# Patient Record
Sex: Male | Born: 1945 | Race: Black or African American | Hispanic: No | Marital: Married | Smoking: Never smoker
Health system: Southern US, Community
[De-identification: ages and names within clinical notes are randomized; demographics above are authoritative.]

## PROBLEM LIST (undated history)

## (undated) DIAGNOSIS — E119 Type 2 diabetes mellitus without complications: Secondary | ICD-10-CM

## (undated) DIAGNOSIS — Z8619 Personal history of other infectious and parasitic diseases: Secondary | ICD-10-CM

## (undated) DIAGNOSIS — K219 Gastro-esophageal reflux disease without esophagitis: Secondary | ICD-10-CM

## (undated) DIAGNOSIS — I10 Essential (primary) hypertension: Secondary | ICD-10-CM

## (undated) DIAGNOSIS — M1990 Unspecified osteoarthritis, unspecified site: Secondary | ICD-10-CM

## (undated) HISTORY — DX: Essential (primary) hypertension: I10

## (undated) HISTORY — DX: Unspecified osteoarthritis, unspecified site: M19.90

## (undated) HISTORY — DX: Type 2 diabetes mellitus without complications: E11.9

## (undated) HISTORY — DX: Personal history of other infectious and parasitic diseases: Z86.19

---

## 1998-04-14 ENCOUNTER — Emergency Department (HOSPITAL_COMMUNITY): Admission: EM | Admit: 1998-04-14 | Discharge: 1998-04-14 | Payer: Self-pay | Admitting: Family Medicine

## 1999-06-27 ENCOUNTER — Emergency Department (HOSPITAL_COMMUNITY): Admission: EM | Admit: 1999-06-27 | Discharge: 1999-06-27 | Payer: Self-pay | Admitting: Emergency Medicine

## 1999-07-03 ENCOUNTER — Emergency Department (HOSPITAL_COMMUNITY): Admission: EM | Admit: 1999-07-03 | Discharge: 1999-07-03 | Payer: Self-pay | Admitting: *Deleted

## 2000-06-06 ENCOUNTER — Emergency Department (HOSPITAL_COMMUNITY): Admission: EM | Admit: 2000-06-06 | Discharge: 2000-06-06 | Payer: Self-pay

## 2000-08-14 ENCOUNTER — Emergency Department (HOSPITAL_COMMUNITY): Admission: EM | Admit: 2000-08-14 | Discharge: 2000-08-14 | Payer: Self-pay | Admitting: Emergency Medicine

## 2007-01-24 ENCOUNTER — Observation Stay (HOSPITAL_COMMUNITY): Admission: EM | Admit: 2007-01-24 | Discharge: 2007-01-24 | Payer: Self-pay | Admitting: Emergency Medicine

## 2011-12-27 DIAGNOSIS — E119 Type 2 diabetes mellitus without complications: Secondary | ICD-10-CM | POA: Diagnosis not present

## 2012-04-18 DIAGNOSIS — E785 Hyperlipidemia, unspecified: Secondary | ICD-10-CM | POA: Diagnosis not present

## 2012-04-18 DIAGNOSIS — K219 Gastro-esophageal reflux disease without esophagitis: Secondary | ICD-10-CM | POA: Diagnosis not present

## 2012-04-18 DIAGNOSIS — Z125 Encounter for screening for malignant neoplasm of prostate: Secondary | ICD-10-CM | POA: Diagnosis not present

## 2012-04-18 DIAGNOSIS — IMO0001 Reserved for inherently not codable concepts without codable children: Secondary | ICD-10-CM | POA: Diagnosis not present

## 2012-11-13 DIAGNOSIS — E785 Hyperlipidemia, unspecified: Secondary | ICD-10-CM | POA: Diagnosis not present

## 2012-11-13 DIAGNOSIS — IMO0001 Reserved for inherently not codable concepts without codable children: Secondary | ICD-10-CM | POA: Diagnosis not present

## 2013-02-05 DIAGNOSIS — IMO0001 Reserved for inherently not codable concepts without codable children: Secondary | ICD-10-CM | POA: Diagnosis not present

## 2013-02-05 DIAGNOSIS — N529 Male erectile dysfunction, unspecified: Secondary | ICD-10-CM | POA: Diagnosis not present

## 2013-02-10 ENCOUNTER — Ambulatory Visit (INDEPENDENT_AMBULATORY_CARE_PROVIDER_SITE_OTHER): Payer: Medicare Other | Admitting: Family Medicine

## 2013-02-10 VITALS — BP 114/72 | HR 80 | Temp 98.6°F | Resp 16 | Ht 66.5 in | Wt 130.4 lb

## 2013-02-10 DIAGNOSIS — B029 Zoster without complications: Secondary | ICD-10-CM

## 2013-02-10 MED ORDER — VALACYCLOVIR HCL 1 G PO TABS: 1000.0000 mg | ORAL_TABLET | Freq: Three times a day (TID) | ORAL | Status: DC

## 2013-02-10 NOTE — Progress Notes (Signed)
67 yo diabetic with 5 days of itchy skin on left back, flank and abdomen.  He never had a shingles shot.  He now has a vesicular rash over the left 11th dermatome.  Objective:  NAD Classic 11th dermatomal left sided rash for shingles:  Vesicular with erythema  Assessment: shingles  Plan: valtrex 1 gm tid x 7days.  Signed, Elvina Sidle, MD

## 2013-02-10 NOTE — Patient Instructions (Signed)
Shingles Shingles (herpes zoster) is an infection that is caused by the same virus that causes chickenpox (varicella). The infection causes a painful skin rash and fluid-filled blisters, which eventually break open, crust over, and heal. It may occur in any area of the body, but it usually affects only one side of the body or face. The pain of shingles usually lasts about 1 month. However, some people with shingles may develop long-term (chronic) pain in the affected area of the body. Shingles often occurs many years after the person had chickenpox. It is more common:  In people older than 50 years.  In people with weakened immune systems, such as those with HIV, AIDS, or cancer.  In people taking medicines that weaken the immune system, such as transplant medicines.  In people under great stress. CAUSES  Shingles is caused by the varicella zoster virus (VZV), which also causes chickenpox. After a person is infected with the virus, it can remain in the person's body for years in an inactive state (dormant). To cause shingles, the virus reactivates and breaks out as an infection in a nerve root. The virus can be spread from person to person (contagious) through contact with open blisters of the shingles rash. It will only spread to people who have not had chickenpox. When these people are exposed to the virus, they may develop chickenpox. They will not develop shingles. Once the blisters scab over, the person is no longer contagious and cannot spread the virus to others. SYMPTOMS  Shingles shows up in stages. The initial symptoms may be pain, itching, and tingling in an area of the skin. This pain is usually described as burning, stabbing, or throbbing.In a few days or weeks, a painful red rash will appear in the area where the pain, itching, and tingling were felt. The rash is usually on one side of the body in a band or belt-like pattern. Then, the rash usually turns into fluid-filled blisters. They  will scab over and dry up in approximately 2 3 weeks. Flu-like symptoms may also occur with the initial symptoms, the rash, or the blisters. These may include:  Fever.  Chills.  Headache.  Upset stomach. DIAGNOSIS  Your caregiver will perform a skin exam to diagnose shingles. Skin scrapings or fluid samples may also be taken from the blisters. This sample will be examined under a microscope or sent to a lab for further testing. TREATMENT  There is no specific cure for shingles. Your caregiver will likely prescribe medicines to help you manage the pain, recover faster, and avoid long-term problems. This may include antiviral drugs, anti-inflammatory drugs, and pain medicines. HOME CARE INSTRUCTIONS   Take a cool bath or apply cool compresses to the area of the rash or blisters as directed. This may help with the pain and itching.   Only take over-the-counter or prescription medicines as directed by your caregiver.   Rest as directed by your caregiver.  Keep your rash and blisters clean with mild soap and cool water or as directed by your caregiver.  Do not pick your blisters or scratch your rash. Apply an anti-itch cream or numbing creams to the affected area as directed by your caregiver.  Keep your shingles rash covered with a loose bandage (dressing).  Avoid skin contact with:  Babies.   Pregnant women.   Children with eczema.   Elderly people with transplants.   People with chronic illnesses, such as leukemia or AIDS.   Wear loose-fitting clothing to help ease   the pain of material rubbing against the rash.  Keep all follow-up appointments with your caregiver.If the area involved is on your face, you may receive a referral for follow-up to a specialist, such as an eye doctor (ophthalmologist) or an ear, nose, and throat (ENT) doctor. Keeping all follow-up appointments will help you avoid eye complications, chronic pain, or disability.  SEEK IMMEDIATE MEDICAL  CARE IF:   You have facial pain, pain around the eye area, or loss of feeling on one side of your face.  You have ear pain or ringing in your ear.  You have loss of taste.  Your pain is not relieved with prescribed medicines.   Your redness or swelling spreads.   You have more pain and swelling.  Your condition is worsening or has changed.   You have a feveror persistent symptoms for more than 2 3 days.  You have a fever and your symptoms suddenly get worse. MAKE SURE YOU:  Understand these instructions.  Will watch your condition.  Will get help right away if you are not doing well or get worse. Document Released: 09/04/2005 Document Revised: 05/29/2012 Document Reviewed: 04/18/2012 [employer] Patient Information 2014 [employer].  

## 2013-02-27 DIAGNOSIS — B0229 Other postherpetic nervous system involvement: Secondary | ICD-10-CM | POA: Diagnosis not present

## 2013-03-05 DIAGNOSIS — E1165 Type 2 diabetes mellitus with hyperglycemia: Secondary | ICD-10-CM | POA: Diagnosis not present

## 2013-03-05 DIAGNOSIS — E1139 Type 2 diabetes mellitus with other diabetic ophthalmic complication: Secondary | ICD-10-CM | POA: Diagnosis not present

## 2013-04-07 DIAGNOSIS — Z1331 Encounter for screening for depression: Secondary | ICD-10-CM | POA: Diagnosis not present

## 2013-04-07 DIAGNOSIS — B0229 Other postherpetic nervous system involvement: Secondary | ICD-10-CM | POA: Diagnosis not present

## 2013-04-07 DIAGNOSIS — R1033 Periumbilical pain: Secondary | ICD-10-CM | POA: Diagnosis not present

## 2013-04-21 ENCOUNTER — Encounter (HOSPITAL_COMMUNITY): Payer: Self-pay | Admitting: *Deleted

## 2013-04-21 ENCOUNTER — Emergency Department (HOSPITAL_COMMUNITY)
Admission: EM | Admit: 2013-04-21 | Discharge: 2013-04-21 | Disposition: A | Discharge status: Home / Self Care | Payer: Medicare Other | Attending: Emergency Medicine | Admitting: Emergency Medicine

## 2013-04-21 DIAGNOSIS — M129 Arthropathy, unspecified: Secondary | ICD-10-CM | POA: Insufficient documentation

## 2013-04-21 DIAGNOSIS — R29818 Other symptoms and signs involving the nervous system: Secondary | ICD-10-CM | POA: Diagnosis not present

## 2013-04-21 DIAGNOSIS — Z7982 Long term (current) use of aspirin: Secondary | ICD-10-CM | POA: Insufficient documentation

## 2013-04-21 DIAGNOSIS — Z794 Long term (current) use of insulin: Secondary | ICD-10-CM | POA: Diagnosis not present

## 2013-04-21 DIAGNOSIS — R519 Headache, unspecified: Secondary | ICD-10-CM

## 2013-04-21 DIAGNOSIS — E119 Type 2 diabetes mellitus without complications: Secondary | ICD-10-CM | POA: Diagnosis not present

## 2013-04-21 DIAGNOSIS — R51 Headache: Secondary | ICD-10-CM | POA: Diagnosis not present

## 2013-04-21 DIAGNOSIS — Z79899 Other long term (current) drug therapy: Secondary | ICD-10-CM | POA: Insufficient documentation

## 2013-04-21 DIAGNOSIS — R110 Nausea: Secondary | ICD-10-CM | POA: Diagnosis not present

## 2013-04-21 NOTE — ED Notes (Signed)
Tonight woke from sleep with sharp pain in Rt side of head, states he was seen at dentist on Monday for puncture to bottom gum and may need to see oral surgery, thinks it may be related since it is on the same side

## 2013-04-21 NOTE — ED Notes (Signed)
Patient is alert and oriented x3.  He was given DC instructions and follow up visit instructions.  Patient gave verbal understanding.  He was DC ambulatory under his own power to home.  V/S stable.  He was not showing any signs of distress on DC 

## 2013-04-21 NOTE — ED Notes (Signed)
Patient is alert and oriented x3.  He is complaining of a headache that started last night in bed when he was awoke from a sharp pain.  He states that his pain has decreased since he has been in the ED.  He denies any other symptoms

## 2013-04-21 NOTE — ED Provider Notes (Signed)
CSN: [id number]     Arrival date & time 04/21/13  0017 History     First MD Initiated Contact with Patient 04/21/13 0058     Chief Complaint  Patient presents with  . Headache    Sharp pain in head   (Consider location/radiation/quality/duration/timing/severity/associated sxs/prior Treatment) HPI Comments: Vision states, that approximately 3 weeks, ago.  He was eating tortillas chips, a puncture the gum over the abnormal bony growth in his mouth.  This is been slow healing.  He was seen by his dentist on Monday, who recommended saltwater gargles.  Tonight after taking a nap.  He woke with sharp pain to the right temporal area.  This concerned him.  He did not take any medication.  Prior to arrival here, but reports that the headache is resolving.  Patient is a 67 y.o. male presenting with headaches. The history is provided by the patient.  Headache Pain location:  R temporal Quality:  Sharp Radiates to:  Does not radiate Severity currently:  2/10 Severity at highest:  5/10 Onset quality:  Sudden Duration:  2 hours Timing:  Constant Progression:  Improving Chronicity:  New Similar to prior headaches: no   Relieved by:  Nothing Ineffective treatments:  None tried Associated symptoms: loss of balance and nausea   Associated symptoms: no congestion, no dizziness, no ear pain, no pain, no fever, no hearing loss, no near-syncope, no neck pain, no sinus pressure and no sore throat     Past Medical History  Diagnosis Date  . Arthritis   . Diabetes mellitus without complication    History reviewed. No pertinent past surgical history. Family History  Problem Relation Age of Onset  . Diabetes Mother    History  Substance Use Topics  . Smoking status: Never Smoker   . Smokeless tobacco: Not on file  . Alcohol Use: No    Review of Systems  Constitutional: Negative for fever.  HENT: Negative for hearing loss, ear pain, congestion, sore throat, neck pain and sinus pressure.    Eyes: Negative for pain.  Cardiovascular: Negative for near-syncope.  Gastrointestinal: Positive for nausea.  Neurological: Positive for headaches and loss of balance. Negative for dizziness.  All other systems reviewed and are negative.    Allergies  Review of patient's allergies indicates no known allergies.  Home Medications   Current Outpatient Rx  Name  Route  Sig  Dispense  Refill  . aspirin 81 MG tablet   Oral   Take 81 mg by mouth daily.         Marland Kitchen HYDROcodone-acetaminophen (NORCO/VICODIN) 5-325 MG per tablet   Oral   Take 0.5 tablets by mouth every 6 (six) hours as needed for pain (Pt states he takes half a tablet).         . insulin glargine (LANTUS) 100 UNIT/ML injection   Subcutaneous   Inject 16 Units into the skin daily. Takes in am         . metFORMIN (GLUCOPHAGE) 850 MG tablet   Oral   Take 425 mg by mouth 2 (two) times daily with a meal. Takes 1/2 tablet of 500mg          . naproxen sodium (ANAPROX) 220 MG tablet   Oral   Take 220 mg by mouth 2 (two) times daily with a meal.         . pregabalin (LYRICA) 100 MG capsule   Oral   Take 100 mg by mouth 2 (two) times daily.  BP 131/76  Pulse 79  Temp(Src) 98.7 F (37.1 C) (Oral)  Resp 18  Ht 5\' 7"  (1.702 m)  Wt 132 lb (59.875 kg)  BMI 20.67 kg/m2  SpO2 99% Physical Exam  Nursing note and vitals reviewed. Constitutional: He is oriented to person, place, and time. He appears well-developed and well-nourished.  HENT:  Head: Normocephalic and atraumatic.  Right Ear: External ear normal.  Left Ear: External ear normal.  Patient has abnormal bony growth inside of his mouth.  He has a superficial lesion over one of these bony growths on the bottom right that appears to be healing.  There is no erythema or drainage  Eyes: Pupils are equal, round, and reactive to light.  Neck: Normal range of motion.  Cardiovascular: Normal rate and regular rhythm.   Pulmonary/Chest: Effort normal and  breath sounds normal.  Musculoskeletal: Normal range of motion. He exhibits no edema.  Lymphadenopathy:    He has no cervical adenopathy.  Neurological: He is alert and oriented to person, place, and time.  Skin: Skin is warm. No rash noted. No erythema.    ED Course   Procedures (including critical care time)  Labs Reviewed - No data to display No results found. 1. Headache     MDM   Patient states, that since, his headache is resolving.  He would rather go home and followup with his primary care physician.  He is refusing any lab work at this time, my concern expressed was for temporal arteritis, but he refused.  This examination, not wanting to wait in the ED for results. He was informed that he could return at any time if he became concerned.  With his health for his headache returned for further evaluation  Arman Filter, NP 04/21/13 0129

## 2013-04-22 NOTE — ED Provider Notes (Signed)
Medical screening examination/treatment/procedure(s) were performed by non-physician practitioner and as supervising physician I was immediately available for consultation/collaboration.    Celene Kras, MD 04/22/13 [id number]

## 2013-04-28 DIAGNOSIS — IMO0001 Reserved for inherently not codable concepts without codable children: Secondary | ICD-10-CM | POA: Diagnosis not present

## 2013-04-28 DIAGNOSIS — B0229 Other postherpetic nervous system involvement: Secondary | ICD-10-CM | POA: Diagnosis not present

## 2013-07-29 DIAGNOSIS — B0229 Other postherpetic nervous system involvement: Secondary | ICD-10-CM | POA: Diagnosis not present

## 2013-07-29 DIAGNOSIS — E785 Hyperlipidemia, unspecified: Secondary | ICD-10-CM | POA: Diagnosis not present

## 2013-07-29 DIAGNOSIS — Z23 Encounter for immunization: Secondary | ICD-10-CM | POA: Diagnosis not present

## 2013-07-29 DIAGNOSIS — IMO0001 Reserved for inherently not codable concepts without codable children: Secondary | ICD-10-CM | POA: Diagnosis not present

## 2013-11-05 DIAGNOSIS — M545 Low back pain, unspecified: Secondary | ICD-10-CM | POA: Diagnosis not present

## 2013-11-05 DIAGNOSIS — E119 Type 2 diabetes mellitus without complications: Secondary | ICD-10-CM | POA: Diagnosis not present

## 2014-07-08 DIAGNOSIS — H0012 Chalazion right lower eyelid: Secondary | ICD-10-CM | POA: Diagnosis not present

## 2014-07-08 DIAGNOSIS — H11159 Pinguecula, unspecified eye: Secondary | ICD-10-CM | POA: Diagnosis not present

## 2014-07-08 DIAGNOSIS — E10319 Type 1 diabetes mellitus with unspecified diabetic retinopathy without macular edema: Secondary | ICD-10-CM | POA: Diagnosis not present

## 2014-07-20 DIAGNOSIS — B0229 Other postherpetic nervous system involvement: Secondary | ICD-10-CM | POA: Diagnosis not present

## 2014-07-20 DIAGNOSIS — Z1389 Encounter for screening for other disorder: Secondary | ICD-10-CM | POA: Diagnosis not present

## 2014-07-20 DIAGNOSIS — M19041 Primary osteoarthritis, right hand: Secondary | ICD-10-CM | POA: Diagnosis not present

## 2014-07-20 DIAGNOSIS — K219 Gastro-esophageal reflux disease without esophagitis: Secondary | ICD-10-CM | POA: Diagnosis not present

## 2014-07-20 DIAGNOSIS — E1165 Type 2 diabetes mellitus with hyperglycemia: Secondary | ICD-10-CM | POA: Diagnosis not present

## 2014-07-20 DIAGNOSIS — E782 Mixed hyperlipidemia: Secondary | ICD-10-CM | POA: Diagnosis not present

## 2014-07-20 DIAGNOSIS — Z125 Encounter for screening for malignant neoplasm of prostate: Secondary | ICD-10-CM | POA: Diagnosis not present

## 2014-07-20 DIAGNOSIS — M19042 Primary osteoarthritis, left hand: Secondary | ICD-10-CM | POA: Diagnosis not present

## 2014-10-13 DIAGNOSIS — Z1211 Encounter for screening for malignant neoplasm of colon: Secondary | ICD-10-CM | POA: Diagnosis not present

## 2014-10-20 DIAGNOSIS — K219 Gastro-esophageal reflux disease without esophagitis: Secondary | ICD-10-CM | POA: Diagnosis not present

## 2014-10-20 DIAGNOSIS — E1165 Type 2 diabetes mellitus with hyperglycemia: Secondary | ICD-10-CM | POA: Diagnosis not present

## 2014-10-20 DIAGNOSIS — E782 Mixed hyperlipidemia: Secondary | ICD-10-CM | POA: Diagnosis not present

## 2014-11-30 DIAGNOSIS — H6982 Other specified disorders of Eustachian tube, left ear: Secondary | ICD-10-CM | POA: Diagnosis not present

## 2014-11-30 DIAGNOSIS — J309 Allergic rhinitis, unspecified: Secondary | ICD-10-CM | POA: Diagnosis not present

## 2015-01-20 DIAGNOSIS — E1165 Type 2 diabetes mellitus with hyperglycemia: Secondary | ICD-10-CM | POA: Diagnosis not present

## 2015-01-20 DIAGNOSIS — E782 Mixed hyperlipidemia: Secondary | ICD-10-CM | POA: Diagnosis not present

## 2015-01-20 DIAGNOSIS — K219 Gastro-esophageal reflux disease without esophagitis: Secondary | ICD-10-CM | POA: Diagnosis not present

## 2015-01-20 DIAGNOSIS — M19041 Primary osteoarthritis, right hand: Secondary | ICD-10-CM | POA: Diagnosis not present

## 2015-02-17 ENCOUNTER — Emergency Department (HOSPITAL_COMMUNITY)
Admission: EM | Admit: 2015-02-17 | Discharge: 2015-02-17 | Disposition: A | Discharge status: Home / Self Care | Payer: Medicare Other | Attending: Emergency Medicine | Admitting: Emergency Medicine

## 2015-02-17 ENCOUNTER — Encounter (HOSPITAL_COMMUNITY): Payer: Self-pay

## 2015-02-17 DIAGNOSIS — M1990 Unspecified osteoarthritis, unspecified site: Secondary | ICD-10-CM | POA: Insufficient documentation

## 2015-02-17 DIAGNOSIS — K5900 Constipation, unspecified: Secondary | ICD-10-CM | POA: Diagnosis not present

## 2015-02-17 DIAGNOSIS — Z79899 Other long term (current) drug therapy: Secondary | ICD-10-CM | POA: Diagnosis not present

## 2015-02-17 DIAGNOSIS — E1165 Type 2 diabetes mellitus with hyperglycemia: Secondary | ICD-10-CM | POA: Diagnosis present

## 2015-02-17 DIAGNOSIS — R42 Dizziness and giddiness: Secondary | ICD-10-CM | POA: Diagnosis not present

## 2015-02-17 DIAGNOSIS — K625 Hemorrhage of anus and rectum: Secondary | ICD-10-CM | POA: Diagnosis not present

## 2015-02-17 DIAGNOSIS — R112 Nausea with vomiting, unspecified: Secondary | ICD-10-CM | POA: Insufficient documentation

## 2015-02-17 DIAGNOSIS — Z7982 Long term (current) use of aspirin: Secondary | ICD-10-CM | POA: Diagnosis not present

## 2015-02-17 LAB — BLOOD GAS, VENOUS
ACID-BASE EXCESS: 3.1 mmol/L — AB (ref 0.0–2.0)
BICARBONATE: 28.9 meq/L — AB (ref 20.0–24.0)
FIO2: 0.21 %
O2 Saturation: 67.9 %
PATIENT TEMPERATURE: 98.6
PO2 VEN: 36.9 mmHg (ref 30.0–45.0)
TCO2: 25.7 mmol/L (ref 0–100)
pCO2, Ven: 50.7 mmHg — ABNORMAL HIGH (ref 45.0–50.0)
pH, Ven: 7.374 — ABNORMAL HIGH (ref 7.250–7.300)

## 2015-02-17 LAB — LIPASE, BLOOD: Lipase: 16 U/L — ABNORMAL LOW (ref 22–51)

## 2015-02-17 LAB — CBC WITH DIFFERENTIAL/PLATELET
BASOS ABS: 0 10*3/uL (ref 0.0–0.1)
Basophils Relative: 1 % (ref 0–1)
Eosinophils Absolute: 0.1 10*3/uL (ref 0.0–0.7)
Eosinophils Relative: 1 % (ref 0–5)
HCT: 41.4 % (ref 39.0–52.0)
HEMOGLOBIN: 13.6 g/dL (ref 13.0–17.0)
Lymphocytes Relative: 19 % (ref 12–46)
Lymphs Abs: 1.7 10*3/uL (ref 0.7–4.0)
MCH: 28.8 pg (ref 26.0–34.0)
MCHC: 32.9 g/dL (ref 30.0–36.0)
MCV: 87.5 fL (ref 78.0–100.0)
MONO ABS: 0.6 10*3/uL (ref 0.1–1.0)
Monocytes Relative: 6 % (ref 3–12)
NEUTROS ABS: 6.4 10*3/uL (ref 1.7–7.7)
Neutrophils Relative %: 73 % (ref 43–77)
Platelets: 297 10*3/uL (ref 150–400)
RBC: 4.73 MIL/uL (ref 4.22–5.81)
RDW: 12.1 % (ref 11.5–15.5)
WBC: 8.9 10*3/uL (ref 4.0–10.5)

## 2015-02-17 LAB — HEPATIC FUNCTION PANEL
ALT: 18 U/L (ref 17–63)
AST: 16 U/L (ref 15–41)
Albumin: 3.7 g/dL (ref 3.5–5.0)
Alkaline Phosphatase: 68 U/L (ref 38–126)
BILIRUBIN DIRECT: 0.2 mg/dL (ref 0.1–0.5)
BILIRUBIN TOTAL: 1.1 mg/dL (ref 0.3–1.2)
Indirect Bilirubin: 0.9 mg/dL (ref 0.3–0.9)
Total Protein: 6.9 g/dL (ref 6.5–8.1)

## 2015-02-17 LAB — URINALYSIS, ROUTINE W REFLEX MICROSCOPIC
Bilirubin Urine: NEGATIVE
HGB URINE DIPSTICK: NEGATIVE
Ketones, ur: 15 mg/dL — AB
Leukocytes, UA: NEGATIVE
Nitrite: NEGATIVE
PH: 7 (ref 5.0–8.0)
Protein, ur: NEGATIVE mg/dL
SPECIFIC GRAVITY, URINE: 1.023 (ref 1.005–1.030)
Urobilinogen, UA: 1 mg/dL (ref 0.0–1.0)

## 2015-02-17 LAB — BASIC METABOLIC PANEL
ANION GAP: 10 (ref 5–15)
BUN: 26 mg/dL — AB (ref 6–20)
CALCIUM: 9 mg/dL (ref 8.9–10.3)
CHLORIDE: 97 mmol/L — AB (ref 101–111)
CO2: 28 mmol/L (ref 22–32)
Creatinine, Ser: 1.04 mg/dL (ref 0.61–1.24)
GFR calc non Af Amer: >60 mL/min (ref >60)
Glucose, Bld: 239 mg/dL — ABNORMAL HIGH (ref 65–99)
Potassium: 4 mmol/L (ref 3.5–5.1)
Sodium: 135 mmol/L (ref 135–145)

## 2015-02-17 LAB — CBG MONITORING, ED: Glucose-Capillary: 216 mg/dL — ABNORMAL HIGH (ref 65–99)

## 2015-02-17 LAB — URINE MICROSCOPIC-ADD ON

## 2015-02-17 MED ORDER — SODIUM CHLORIDE 0.9 % IV BOLUS (SEPSIS)
1000.0000 mL | Freq: Once | INTRAVENOUS | Status: AC
  Administered 2015-02-17: 1000 mL via INTRAVENOUS

## 2015-02-17 MED ORDER — MECLIZINE HCL 32 MG PO TABS: 32.0000 mg | ORAL_TABLET | Freq: Three times a day (TID) | ORAL | Status: DC | PRN

## 2015-02-17 NOTE — ED Notes (Signed)
Ambulated in hall without assistance.

## 2015-02-17 NOTE — Discharge Instructions (Signed)
Make sure to drink plenty of fluids. Take your medications as prescribed by your doctor. Take meclizine as prescribed as needed for dizziness. Follow up with your doctor in 2-3 days for recheck. Return if worsening symptoms.    Benign Positional Vertigo Vertigo means you feel like you or your surroundings are moving when they are not. Benign positional vertigo is the most common form of vertigo. Benign means that the cause of your condition is not serious. Benign positional vertigo is more common in older adults. CAUSES  Benign positional vertigo is the result of an upset in the labyrinth system. This is an area in the middle ear that helps control your balance. This may be caused by a viral infection, head injury, or repetitive motion. However, often no specific cause is found. SYMPTOMS  Symptoms of benign positional vertigo occur when you move your head or eyes in different directions. Some of the symptoms may include:  Loss of balance and falls.  Vomiting.  Blurred vision.  Dizziness.  Nausea.  Involuntary eye movements (nystagmus). DIAGNOSIS  Benign positional vertigo is usually diagnosed by physical exam. If the specific cause of your benign positional vertigo is unknown, your caregiver may perform imaging tests, such as magnetic resonance imaging (MRI) or computed tomography (CT). TREATMENT  Your caregiver may recommend movements or procedures to correct the benign positional vertigo. Medicines such as meclizine, benzodiazepines, and medicines for nausea may be used to treat your symptoms. In rare cases, if your symptoms are caused by certain conditions that affect the inner ear, you may need surgery. HOME CARE INSTRUCTIONS   Follow your caregiver's instructions.  Move slowly. Do not make sudden body or head movements.  Avoid driving.  Avoid operating heavy machinery.  Avoid performing any tasks that would be dangerous to you or others during a vertigo episode.  Drink  enough fluids to keep your urine clear or pale yellow. SEEK IMMEDIATE MEDICAL CARE IF:   You develop problems with walking, weakness, numbness, or using your arms, hands, or legs.  You have difficulty speaking.  You develop severe headaches.  Your nausea or vomiting continues or gets worse.  You develop visual changes.  Your family or friends notice any behavioral changes.  Your condition gets worse.  You have a fever.  You develop a stiff neck or sensitivity to light. MAKE SURE YOU:   Understand these instructions.  Will watch your condition.  Will get help right away if you are not doing well or get worse. Document Released: 06/12/2006 Document Revised: 11/27/2011 Document Reviewed: 05/25/2011 [facility] Patient Information 2015 [place], [place]. This information is not intended to replace advice given to you by your health care provider. Make sure you discuss any questions you have with your health care provider.

## 2015-02-17 NOTE — ED Notes (Signed)
Patient informed that final test result is back.  States, "In 10 minutes, I will have been here three hours. This is poor service." Acknowledged patient's concern and explained the treatment he received (IV fluids, laboratory tests) normally takes some time.

## 2015-02-17 NOTE — ED Notes (Signed)
Pt presents with c/o hyperglycemia. Pt reports he does take medication for his diabetes and waited later than normal to take his insulin earlier this week after having a strawberry slushie, some ice cream, and hamburger.

## 2015-02-17 NOTE — ED Notes (Signed)
Patient reports yesterday morning he became dizzy, lightheaded, generalized weakness with sweating. Pt reports he thought his blood sugar had dropped so he drunk some orange juice. All day yesterday, he felt bad and his blood sugar was in the 200-300's. This morning, he felt "swimming headed" with slight nausea. Denies blurry vision.

## 2015-02-17 NOTE — ED Notes (Signed)
Pt has urinal at bedside 

## 2015-02-17 NOTE — ED Provider Notes (Signed)
CSN: [id number]     Arrival date & time 02/17/15  0737 History   None    Chief Complaint  Patient presents with  . Hyperglycemia     (Consider location/radiation/quality/duration/timing/severity/associated sxs/prior Treatment) HPI Cory Maddox is a 69 y.o. male with history of diabetes and arthritis presents to emergency department complaining of dizzy episodes. Patient states he vacationed at the beach this past weekend. He admits to drinking slightly excessive amount of alcohol on Saturday evening. He states Monday morning he had an episode of dizziness where "my whole bed was spinning around." He states he felt like his blood sugar dropped. He is asked his wife to bring him orange juice which she he thinks may have helped. He states he did check his blood sugar and it was over 200. He has been keeping a close eye on his blood sugar for the last 2 days it has been in 200s and 300s. He states he felt "swimmy headed" last night and again this morning, and states this morning he started having dry heaves. He states he had 2 episodes of dry heaving, no  actual vomiting. He states his stomach feels queasy. He does say he felt like he was constipated and has to take a Dulcolax on 2 days ago and had a bowel movement yesterday morning. Patient states this morning the dizziness is not as severe as it was yesterday, and currently feels well. Denies any visual changes, no numbness or weakness in extremities, no difficulty walking.    Past Medical History  Diagnosis Date  . Arthritis   . Diabetes mellitus without complication    History reviewed. No pertinent past surgical history. Family History  Problem Relation Age of Onset  . Diabetes Mother    History  Substance Use Topics  . Smoking status: Never Smoker   . Smokeless tobacco: Not on file  . Alcohol Use: No    Review of Systems  Constitutional: Negative for fever and chills.  Respiratory: Negative for cough, chest tightness and shortness  of breath.   Cardiovascular: Negative for chest pain, palpitations and leg swelling.  Gastrointestinal: Positive for nausea, vomiting, constipation and anal bleeding. Negative for abdominal pain, diarrhea and abdominal distention.  Genitourinary: Negative for dysuria, urgency, frequency and hematuria.  Musculoskeletal: Negative for myalgias, arthralgias, neck pain and neck stiffness.  Skin: Negative for rash.  Allergic/Immunologic: Negative for immunocompromised state.  Neurological: Positive for dizziness and light-headedness. Negative for weakness, numbness and headaches.  All other systems reviewed and are negative.     Allergies  Review of patient's allergies indicates no known allergies.  Home Medications   Prior to Admission medications   Medication Sig Start Date End Date Taking? Authorizing Provider  aspirin 81 MG tablet Take 81 mg by mouth daily.   Yes Historical Provider, MD  bisacodyl (DULCOLAX) 5 MG EC tablet Take 10 mg by mouth daily as needed for moderate constipation (for constipation.).   Yes Historical Provider, MD  LANTUS SOLOSTAR 100 UNIT/ML Solostar Pen Inject 16 Units into the skin daily. 02/09/15  Yes Historical Provider, MD  levocetirizine (XYZAL) 5 MG tablet Take 5 mg by mouth daily. 02/01/15  Yes Historical Provider, MD  metFORMIN (GLUCOPHAGE) 850 MG tablet Take 425-850 mg by mouth 2 (two) times daily with a meal. 850 in the morning and 425 in the evening   Yes Historical Provider, MD  pantoprazole (PROTONIX) 40 MG tablet Take 40 mg by mouth daily. 01/11/15  Yes Historical Provider, MD   BP  128/80 mmHg  Pulse 84  Temp(Src) 97.8 F (36.6 C) (Oral)  Resp 18  SpO2 100% Physical Exam  Constitutional: He is oriented to person, place, and time. He appears well-developed and well-nourished. No distress.  HENT:  Head: Normocephalic and atraumatic.  Right Ear: External ear normal.  Left Ear: External ear normal.  Nose: Nose normal.  Normal TMs bilaterally  Eyes:  Conjunctivae and EOM are normal. Pupils are equal, round, and reactive to light.  Mild right sided nystagmus  Neck: Normal range of motion. Neck supple.  No midline cervical   Cardiovascular: Normal rate, regular rhythm and normal heart sounds.   Pulmonary/Chest: Effort normal. No respiratory distress. He has no wheezes. He has no rales.  Abdominal: Soft. Bowel sounds are normal. He exhibits no distension. There is no tenderness. There is no rebound.  Musculoskeletal: He exhibits no edema.  Neurological: He is alert and oriented to person, place, and time. No cranial nerve deficit. Coordination normal.  5/5 and equal upper and lower extremity strength bilaterally. Equal grip strength bilaterally. Normal finger to nose and heel to shin. No pronator drift.  Skin: Skin is warm and dry.  Nursing note and vitals reviewed.   ED Course  Procedures (including critical care time) Labs Review Labs Reviewed  BASIC METABOLIC PANEL - Abnormal; Notable for the following:    Chloride 97 (*)    Glucose, Bld 239 (*)    BUN 26 (*)    All other components within normal limits  BLOOD GAS, VENOUS - Abnormal; Notable for the following:    pH, Ven 7.374 (*)    pCO2, Ven 50.7 (*)    Bicarbonate 28.9 (*)    Acid-Base Excess 3.1 (*)    All other components within normal limits  LIPASE, BLOOD - Abnormal; Notable for the following:    Lipase 16 (*)    All other components within normal limits  URINALYSIS, ROUTINE W REFLEX MICROSCOPIC (NOT AT [facility]) - Abnormal; Notable for the following:    Glucose, UA >1000 (*)    Ketones, ur 15 (*)    All other components within normal limits  CBG MONITORING, ED - Abnormal; Notable for the following:    Glucose-Capillary 216 (*)    All other components within normal limits  CBC WITH DIFFERENTIAL/PLATELET  HEPATIC FUNCTION PANEL  URINE MICROSCOPIC-ADD ON  I-STAT TROPOININ, ED    Imaging Review No results found.   EKG Interpretation   Date/Time:  Wednesday February 17 2015 06:50:11 EDT Ventricular Rate:  72 PR Interval:  137 QRS Duration: 79 QT Interval:  375 QTC Calculation: 410 R Axis:   61 Text Interpretation:  Sinus rhythm Baseline wander in lead(s) V5, V6  Confirmed by [place] ([id number]) on 02/17/2015 7:37:50 AM      MDM   Final diagnoses:  Vertigo   6:55 AM patient seen and examined. Patient with intermittent episodes of dizziness which patient attributes to possibly draw blood sugar however every time he has checked his sugar was over 200. I don't think his symptoms are because of his blood sugar. He is on metformin and Lantus which she takes in the mornings. He is currently asymptomatic but did have some dizziness this morning prior to coming in. Question vertigo. Will get labs, IV fluids are running, urinalysis, monitor.  8:24 AM Patient is hyperglycemic, normal anion gap, BUN slightly elevated, question mild dehydration. Electrolytes are otherwise unremarkable. Patient is asymptomatic at this time. He is neurovascularly intact. Urinalysis is  unremarkable other than elevated glucose. Patient received 1 L of normal saline. He ambulated in the hallway with no dizziness or ataxia. He continues to be asymptomatic. He states he is hungry. He states he is ready to go home. His symptoms are most consistent with benign positional vertigo. We will discharge him home on meclizine. Close follow-up with primary care doctor. Return precautions discussed.  Filed Vitals:   02/17/15 0525 02/17/15 0742  BP: 128/80 128/69  Pulse: 84 72  Temp: 97.8 F (36.6 C)   TempSrc: Oral   Resp: 18 12  SpO2: 100% 98%       Jeannett Senior, PA-C 02/17/15 [place], MD 02/21/15 1552

## 2015-04-22 DIAGNOSIS — M19042 Primary osteoarthritis, left hand: Secondary | ICD-10-CM | POA: Diagnosis not present

## 2015-04-22 DIAGNOSIS — M19041 Primary osteoarthritis, right hand: Secondary | ICD-10-CM | POA: Diagnosis not present

## 2015-04-22 DIAGNOSIS — E1165 Type 2 diabetes mellitus with hyperglycemia: Secondary | ICD-10-CM | POA: Diagnosis not present

## 2015-04-22 DIAGNOSIS — K219 Gastro-esophageal reflux disease without esophagitis: Secondary | ICD-10-CM | POA: Diagnosis not present

## 2015-04-22 DIAGNOSIS — E782 Mixed hyperlipidemia: Secondary | ICD-10-CM | POA: Diagnosis not present

## 2015-07-12 DIAGNOSIS — H5203 Hypermetropia, bilateral: Secondary | ICD-10-CM | POA: Diagnosis not present

## 2015-07-12 DIAGNOSIS — E109 Type 1 diabetes mellitus without complications: Secondary | ICD-10-CM | POA: Diagnosis not present

## 2015-07-12 DIAGNOSIS — H11159 Pinguecula, unspecified eye: Secondary | ICD-10-CM | POA: Diagnosis not present

## 2015-07-12 DIAGNOSIS — H52223 Regular astigmatism, bilateral: Secondary | ICD-10-CM | POA: Diagnosis not present

## 2015-07-20 DIAGNOSIS — E1165 Type 2 diabetes mellitus with hyperglycemia: Secondary | ICD-10-CM | POA: Diagnosis not present

## 2015-07-20 DIAGNOSIS — K219 Gastro-esophageal reflux disease without esophagitis: Secondary | ICD-10-CM | POA: Diagnosis not present

## 2015-07-20 DIAGNOSIS — Z125 Encounter for screening for malignant neoplasm of prostate: Secondary | ICD-10-CM | POA: Diagnosis not present

## 2015-07-20 DIAGNOSIS — E782 Mixed hyperlipidemia: Secondary | ICD-10-CM | POA: Diagnosis not present

## 2016-01-27 DIAGNOSIS — Z794 Long term (current) use of insulin: Secondary | ICD-10-CM | POA: Diagnosis not present

## 2016-01-27 DIAGNOSIS — E1165 Type 2 diabetes mellitus with hyperglycemia: Secondary | ICD-10-CM | POA: Diagnosis not present

## 2016-01-27 DIAGNOSIS — E782 Mixed hyperlipidemia: Secondary | ICD-10-CM | POA: Diagnosis not present

## 2016-01-27 DIAGNOSIS — Z7984 Long term (current) use of oral hypoglycemic drugs: Secondary | ICD-10-CM | POA: Diagnosis not present

## 2016-05-09 DIAGNOSIS — E1165 Type 2 diabetes mellitus with hyperglycemia: Secondary | ICD-10-CM | POA: Diagnosis not present

## 2016-05-09 DIAGNOSIS — E782 Mixed hyperlipidemia: Secondary | ICD-10-CM | POA: Diagnosis not present

## 2016-05-09 DIAGNOSIS — Z794 Long term (current) use of insulin: Secondary | ICD-10-CM | POA: Diagnosis not present

## 2016-05-09 DIAGNOSIS — K219 Gastro-esophageal reflux disease without esophagitis: Secondary | ICD-10-CM | POA: Diagnosis not present

## 2016-07-25 DIAGNOSIS — E119 Type 2 diabetes mellitus without complications: Secondary | ICD-10-CM | POA: Diagnosis not present

## 2016-08-16 DIAGNOSIS — E782 Mixed hyperlipidemia: Secondary | ICD-10-CM | POA: Diagnosis not present

## 2016-08-16 DIAGNOSIS — Z1211 Encounter for screening for malignant neoplasm of colon: Secondary | ICD-10-CM | POA: Diagnosis not present

## 2016-08-16 DIAGNOSIS — K219 Gastro-esophageal reflux disease without esophagitis: Secondary | ICD-10-CM | POA: Diagnosis not present

## 2016-08-16 DIAGNOSIS — E1165 Type 2 diabetes mellitus with hyperglycemia: Secondary | ICD-10-CM | POA: Diagnosis not present

## 2016-08-16 DIAGNOSIS — Z125 Encounter for screening for malignant neoplasm of prostate: Secondary | ICD-10-CM | POA: Diagnosis not present

## 2016-10-30 DIAGNOSIS — Z7984 Long term (current) use of oral hypoglycemic drugs: Secondary | ICD-10-CM | POA: Diagnosis not present

## 2016-10-30 DIAGNOSIS — K219 Gastro-esophageal reflux disease without esophagitis: Secondary | ICD-10-CM | POA: Diagnosis not present

## 2016-10-30 DIAGNOSIS — Z794 Long term (current) use of insulin: Secondary | ICD-10-CM | POA: Diagnosis not present

## 2016-10-30 DIAGNOSIS — E1165 Type 2 diabetes mellitus with hyperglycemia: Secondary | ICD-10-CM | POA: Diagnosis not present

## 2016-10-30 DIAGNOSIS — E782 Mixed hyperlipidemia: Secondary | ICD-10-CM | POA: Diagnosis not present

## 2016-10-30 DIAGNOSIS — M19042 Primary osteoarthritis, left hand: Secondary | ICD-10-CM | POA: Diagnosis not present

## 2016-10-30 DIAGNOSIS — M19041 Primary osteoarthritis, right hand: Secondary | ICD-10-CM | POA: Diagnosis not present

## 2016-11-29 DIAGNOSIS — M25552 Pain in left hip: Secondary | ICD-10-CM | POA: Diagnosis not present

## 2016-11-29 DIAGNOSIS — M791 Myalgia: Secondary | ICD-10-CM | POA: Diagnosis not present

## 2017-02-08 DIAGNOSIS — M150 Primary generalized (osteo)arthritis: Secondary | ICD-10-CM | POA: Diagnosis not present

## 2017-02-08 DIAGNOSIS — J301 Allergic rhinitis due to pollen: Secondary | ICD-10-CM | POA: Diagnosis not present

## 2017-02-08 DIAGNOSIS — E1165 Type 2 diabetes mellitus with hyperglycemia: Secondary | ICD-10-CM | POA: Diagnosis not present

## 2017-02-08 DIAGNOSIS — E782 Mixed hyperlipidemia: Secondary | ICD-10-CM | POA: Diagnosis not present

## 2017-05-18 DIAGNOSIS — K219 Gastro-esophageal reflux disease without esophagitis: Secondary | ICD-10-CM | POA: Diagnosis not present

## 2017-05-18 DIAGNOSIS — Z7984 Long term (current) use of oral hypoglycemic drugs: Secondary | ICD-10-CM | POA: Diagnosis not present

## 2017-05-18 DIAGNOSIS — E1165 Type 2 diabetes mellitus with hyperglycemia: Secondary | ICD-10-CM | POA: Diagnosis not present

## 2017-05-18 DIAGNOSIS — E782 Mixed hyperlipidemia: Secondary | ICD-10-CM | POA: Diagnosis not present

## 2017-05-18 DIAGNOSIS — M150 Primary generalized (osteo)arthritis: Secondary | ICD-10-CM | POA: Diagnosis not present

## 2017-05-18 DIAGNOSIS — Z1389 Encounter for screening for other disorder: Secondary | ICD-10-CM | POA: Diagnosis not present

## 2017-05-18 DIAGNOSIS — J301 Allergic rhinitis due to pollen: Secondary | ICD-10-CM | POA: Diagnosis not present

## 2017-05-18 DIAGNOSIS — Z794 Long term (current) use of insulin: Secondary | ICD-10-CM | POA: Diagnosis not present

## 2017-06-13 DIAGNOSIS — M79642 Pain in left hand: Secondary | ICD-10-CM | POA: Diagnosis not present

## 2017-06-13 DIAGNOSIS — M79641 Pain in right hand: Secondary | ICD-10-CM | POA: Diagnosis not present

## 2017-06-13 DIAGNOSIS — M79645 Pain in left finger(s): Secondary | ICD-10-CM | POA: Diagnosis not present

## 2017-06-18 ENCOUNTER — Emergency Department (HOSPITAL_COMMUNITY)
Admission: EM | Admit: 2017-06-18 | Discharge: 2017-06-19 | Discharge status: Against Medical Advice | Payer: Medicare Other | Attending: Emergency Medicine | Admitting: Emergency Medicine

## 2017-06-18 ENCOUNTER — Encounter (HOSPITAL_COMMUNITY): Payer: Self-pay

## 2017-06-18 DIAGNOSIS — Z5321 Procedure and treatment not carried out due to patient leaving prior to being seen by health care provider: Secondary | ICD-10-CM | POA: Diagnosis not present

## 2017-06-18 DIAGNOSIS — M79662 Pain in left lower leg: Secondary | ICD-10-CM | POA: Diagnosis not present

## 2017-06-18 NOTE — ED Triage Notes (Signed)
Pt complains of left lower leg pain after going to the gym today, he states that the pain is worse as the night goes on and radiates up his thigh

## 2017-06-18 NOTE — ED Notes (Signed)
Pt c/o 6/10 aching pain on the lateral part of the shin onset 19:00 tonight. Good pulse, movement, and sensation to the right foot. Pt had been active all day, then when sitting down and after uncrossing his leg, the pain began. Ambulatory with a limp.

## 2017-06-19 ENCOUNTER — Emergency Department (HOSPITAL_COMMUNITY): Payer: Medicare Other

## 2017-06-19 NOTE — ED Notes (Signed)
No answer when called for xray

## 2017-06-19 NOTE — ED Notes (Signed)
No answer when called for xray x 3

## 2017-09-04 DIAGNOSIS — H524 Presbyopia: Secondary | ICD-10-CM | POA: Diagnosis not present

## 2017-09-04 DIAGNOSIS — E11319 Type 2 diabetes mellitus with unspecified diabetic retinopathy without macular edema: Secondary | ICD-10-CM | POA: Diagnosis not present

## 2017-09-04 DIAGNOSIS — H11159 Pinguecula, unspecified eye: Secondary | ICD-10-CM | POA: Diagnosis not present

## 2017-09-04 DIAGNOSIS — Z7984 Long term (current) use of oral hypoglycemic drugs: Secondary | ICD-10-CM | POA: Diagnosis not present

## 2017-09-04 DIAGNOSIS — H52223 Regular astigmatism, bilateral: Secondary | ICD-10-CM | POA: Diagnosis not present

## 2017-09-04 DIAGNOSIS — Z794 Long term (current) use of insulin: Secondary | ICD-10-CM | POA: Diagnosis not present

## 2017-09-04 DIAGNOSIS — H5203 Hypermetropia, bilateral: Secondary | ICD-10-CM | POA: Diagnosis not present

## 2017-09-04 LAB — HM DIABETES EYE EXAM

## 2017-09-14 DIAGNOSIS — E1165 Type 2 diabetes mellitus with hyperglycemia: Secondary | ICD-10-CM | POA: Diagnosis not present

## 2017-09-14 DIAGNOSIS — K219 Gastro-esophageal reflux disease without esophagitis: Secondary | ICD-10-CM | POA: Diagnosis not present

## 2017-09-14 DIAGNOSIS — J301 Allergic rhinitis due to pollen: Secondary | ICD-10-CM | POA: Diagnosis not present

## 2017-09-14 DIAGNOSIS — Z23 Encounter for immunization: Secondary | ICD-10-CM | POA: Diagnosis not present

## 2017-09-14 DIAGNOSIS — Z125 Encounter for screening for malignant neoplasm of prostate: Secondary | ICD-10-CM | POA: Diagnosis not present

## 2017-09-14 DIAGNOSIS — E782 Mixed hyperlipidemia: Secondary | ICD-10-CM | POA: Diagnosis not present

## 2017-09-14 DIAGNOSIS — Z1211 Encounter for screening for malignant neoplasm of colon: Secondary | ICD-10-CM | POA: Diagnosis not present

## 2017-09-14 LAB — MICROALBUMIN, URINE: MICROALB UR: 105

## 2017-09-14 LAB — HEMOGLOBIN A1C: Hemoglobin A1C: 10.9

## 2017-11-07 ENCOUNTER — Ambulatory Visit (INDEPENDENT_AMBULATORY_CARE_PROVIDER_SITE_OTHER): Payer: Medicare Other | Admitting: Physician Assistant

## 2017-11-07 ENCOUNTER — Encounter: Payer: Self-pay | Admitting: Physician Assistant

## 2017-11-07 VITALS — BP 152/96 | HR 86 | Temp 98.0°F | Ht 67.0 in | Wt 154.4 lb

## 2017-11-07 DIAGNOSIS — R030 Elevated blood-pressure reading, without diagnosis of hypertension: Secondary | ICD-10-CM | POA: Diagnosis not present

## 2017-11-07 DIAGNOSIS — I10 Essential (primary) hypertension: Secondary | ICD-10-CM

## 2017-11-07 DIAGNOSIS — Z7689 Persons encountering health services in other specified circumstances: Secondary | ICD-10-CM | POA: Diagnosis not present

## 2017-11-07 DIAGNOSIS — E1165 Type 2 diabetes mellitus with hyperglycemia: Secondary | ICD-10-CM

## 2017-11-07 LAB — BASIC METABOLIC PANEL
BUN: 15 mg/dL (ref 6–23)
CHLORIDE: 100 meq/L (ref 96–112)
CO2: 29 mEq/L (ref 19–32)
Calcium: 9.2 mg/dL (ref 8.4–10.5)
Creatinine, Ser: 1.1 mg/dL (ref 0.40–1.50)
GFR: 84.74 mL/min (ref >60.00)
Glucose, Bld: 298 mg/dL — ABNORMAL HIGH (ref 70–99)
Potassium: 4.4 mEq/L (ref 3.5–5.1)
SODIUM: 135 meq/L (ref 135–145)

## 2017-11-07 MED ORDER — LISINOPRIL 10 MG PO TABS: 10.0000 mg | ORAL_TABLET | Freq: Every day | ORAL | 1 refills | Status: DC

## 2017-11-07 NOTE — Progress Notes (Signed)
Cory Maddox is a 72 y.o. male here to [place] and discuss elevated blood pressure.  I acted as a Education administrator for [employer], PA-C Cory Pickler, LPN  History of Present Illness:   Chief Complaint  Patient presents with  . Establish Care    Medicare  . Hypertension   Patient's wife, who is a patient in our office, made Cory Maddox appointment today to establish care and discuss his HTN and DM. Patient reports that he has been a patient with Cory Maddox at [facility] since 1992. Goes every 3 months for his HgbA1c to have checked. Most recent appointment his BP was up at 156/100, he tells me that he was told to just watch it. His wife would like for him to transfer care to our office.  Elevated Blood Pressure Reading Currently not taking any medications. Does not check blood pressures regularly. Has been taking Advil for arthritis and trigger finger, but not regularly.  Patient denies chest pain, SOB, blurred vision, dizziness, unusual headaches, lower leg swelling. Does have occasional lightheadedness. Denies excessive caffeine intake (1 cup daily) and caffeinated tea (1 cup daily), stimulant usage, excessive alcohol intake, or increase in salt consumption. Consumes egg and cheese biscuit, hashbrown for breakfast daily. Has been eating more pork recently, specifically ribs.  Diabetes Type 2 Patient reports that his PCP was managing his DM, he has never seen an endocrinologist. He is on Metformin, takes 850 mg every morning and then will take 1/2 of a dose (425 mg) if he feels like he needs it. He is on Lantus daily, takes 21 units daily. States that most recently he was on 18 units in December but states that this was increased by his PCP. Most recent HgbA1c was 10.9. Tells me that "it's always been between 10-12." States that he was on Januvia at one point, but doesn't remember why he stopped taking it. Blood sugar is around 230 every morning.  Health Maintenance: Immunizations  -- needs PNA and flu shot, declines today Colonoscopy -- has never had, declines today Weight -- Weight: 154 lb 6.1 oz (70 kg) -- plays a lot of golf; loses weight during summer. Average 145-155 lb.  Depression screen [facility] 2/9 11/07/2017  Decreased Interest 0  Down, Depressed, Hopeless 0  PHQ - 2 Score 0    No flowsheet data found.   Other providers/specialists: Eye doctor  -- Cory Maddox Dentist -- hasn't been in 3 years   Past Medical History:  Diagnosis Date  . Arthritis   . Diabetes mellitus without complication ([place])   . History of shingles   . Hypertension      Social History   Socioeconomic History  . Marital status: Married    Spouse name: Not on file  . Number of children: Not on file  . Years of education: Not on file  . Highest education level: Not on file  Social Needs  . Financial resource strain: Not on file  . Food insecurity - worry: Not on file  . Food insecurity - inability: Not on file  . Transportation needs - medical: Not on file  . Transportation needs - non-medical: Not on file  Occupational History  . Not on file  Tobacco Use  . Smoking status: Never Smoker  . Smokeless tobacco: Never Used  Substance and Sexual Activity  . Alcohol use: Yes    Alcohol/week: 3.0 oz    Types: 5 Glasses of wine per week  . Drug use: No  .  Sexual activity: Yes    Comment: married  Other Topics Concern  . Not on file  Social History Narrative   Married   Retired from [employer] in 2011   Loves to [place]    History reviewed. No pertinent surgical history.  Family History  Problem Relation Age of Onset  . Diabetes Mother     No Known Allergies   Current Medications:   Current Outpatient Medications:  .  aspirin 81 MG tablet, Take 81 mg by mouth daily., Disp: , Rfl:  .  atorvastatin (LIPITOR) 40 MG tablet, TK 1 T PO ONCE A DAY, Disp: , Rfl: 2 .  B-D UF III MINI PEN NEEDLES 31G X 5 MM MISC, USE TO CHECK BLOOD SUGAR BID, Disp: , Rfl: 3 .  Blood Glucose  Monitoring Suppl (ACCU-CHEK GUIDE) w/Device KIT, U UTD TO CHECK BLOOD SUGAR, Disp: , Rfl: 0 .  LANTUS SOLOSTAR 100 UNIT/ML Solostar Pen, Inject 21 Units into the skin daily. , Disp: , Rfl: 6 .  levocetirizine (XYZAL) 5 MG tablet, Take 5 mg by mouth daily., Disp: , Rfl: 4 .  meclizine (ANTIVERT) 32 MG tablet, Take 1 tablet (32 mg total) by mouth 3 (three) times daily as needed., Disp: 30 tablet, Rfl: 0 .  metFORMIN (GLUCOPHAGE) 850 MG tablet, Take 425-850 mg by mouth 2 (two) times daily with a meal. 850 in the morning and 425 in the evening, Disp: , Rfl:  .  pantoprazole (PROTONIX) 40 MG tablet, Take 40 mg by mouth daily., Disp: , Rfl: 2 .  lisinopril (PRINIVIL,ZESTRIL) 10 MG tablet, Take 1 tablet (10 mg total) by mouth daily., Disp: 30 tablet, Rfl: 1   Review of Systems:   Review of Systems  Constitutional: Negative.  Negative for chills, fever, malaise/fatigue and weight loss.  HENT: Negative.  Negative for hearing loss, sinus pain and sore throat.   Eyes: Negative.  Negative for blurred vision.  Respiratory: Negative.  Negative for cough and shortness of breath.   Cardiovascular: Negative.  Negative for chest pain, palpitations and leg swelling.  Gastrointestinal: Negative.  Negative for abdominal pain, constipation, diarrhea, heartburn, nausea and vomiting.  Genitourinary: Negative.  Negative for dysuria, frequency and urgency.  Musculoskeletal: Negative.  Negative for back pain, myalgias and neck pain.  Skin: Negative.  Negative for itching and rash.  Neurological: Positive for dizziness. Negative for tingling, seizures, loss of consciousness and headaches.  Endo/Heme/Allergies: Negative.  Negative for polydipsia.  Psychiatric/Behavioral: Negative.  Negative for depression. The patient is not nervous/anxious.     Vitals:   Vitals:   11/07/17 1327 11/07/17 1426  BP: (!) 160/94 (!) 152/96  Pulse: 86   Temp: 98 F (36.7 C)   TempSrc: Oral   SpO2: 96%   Weight: 154 lb 6.1 oz (70  kg)   Height: '5\' 7"'$  (1.702 m)      Body mass index is 24.18 kg/m.  Physical Exam:   Physical Exam  Constitutional: He appears well-developed. He is cooperative.  Non-toxic appearance. He does not have a sickly appearance. He does not appear ill. No distress.  Cardiovascular: Normal rate, regular rhythm, S1 normal, S2 normal, normal heart sounds and normal pulses.  No LE edema  Pulmonary/Chest: Effort normal and breath sounds normal.  Neurological: He is alert. GCS eye subscore is 4. GCS verbal subscore is 5. GCS motor subscore is 6.  Skin: Skin is warm, dry and intact.  Psychiatric: He has a normal mood and affect. His speech is normal and  behavior is normal.  Nursing note and vitals reviewed.  EKG tracing is personally reviewed.  EKG notes NSR.  No acute changes.   Assessment and Plan:    Cy was seen today for establish care and hypertension.  Diagnoses and all orders for this visit:  Essential hypertension This is a new diagnosis for this patient. Given uncontrolled DM, will start ACE-I. Initiate Lisinopril 10 mg. Follow-up with [place] in 2 weeks. BMP today. EKG tracing is personally reviewed.  EKG notes NSR.  No acute changes. Discussed with patient that he should follow-up sooner if he develops any unusual or new symptoms. -     EKG 12-Lead -     Basic metabolic panel  Encounter to establish care Patient is behind on several health maintenance issues. Will request records from prior PCP and encourage patient get up to date at future visits.  Uncontrolled type 2 diabetes mellitus with hyperglycemia ([place]) Uncontrolled per most recent HgbA1c of 10.9. Question if there is a history of non-compliance. Would benefit from nutrition education in the future. Discussed briefly with Dr. Briscoe Deutscher. Advised patient as follows "Check your blood glucose every morning before eating. If your glucose is above 150 for three mornings in a row, increase your dose of insulin by 2 units."  Follow-up with [place] in 2 weeks.  Will work closely with patient to tighten up blood sugars. Consider adding in GLP-1 RA. -     Basic metabolic panel  Other orders -     lisinopril (PRINIVIL,ZESTRIL) 10 MG tablet; Take 1 tablet (10 mg total) by mouth daily.    . Reviewed expectations re: course of current medical issues. . Discussed self-management of symptoms. . Outlined signs and symptoms indicating need for more acute intervention. . Patient verbalized understanding and all questions were answered. . See orders for this visit as documented in the electronic medical record. . Patient received an After-Visit Summary.   CMA or LPN served as scribe during this visit. History, Physical, and Plan performed by medical provider. Documentation and orders reviewed and attested to.   Inda Coke, PA-C

## 2017-11-07 NOTE — Patient Instructions (Addendum)
It was great to meet you!  Check your blood glucose every morning before eating. If your fasting blood sugar is great than 150 for three mornings in a row, increase your dose of basal insulin by 2 units. Continue this regimen until we consistently get your blood sugar under 150.  Start the blood pressure medication daily. If you develop any changes in symptoms please let us know. Keep a log of your blood pressures for [place].   Let's follow-up in 2 weeks.

## 2017-11-08 ENCOUNTER — Encounter: Payer: Self-pay | Admitting: *Deleted

## 2017-11-08 ENCOUNTER — Ambulatory Visit: Payer: Medicare Other | Admitting: Family Medicine

## 2017-11-12 ENCOUNTER — Encounter: Payer: Self-pay | Admitting: Physician Assistant

## 2017-11-21 ENCOUNTER — Ambulatory Visit (INDEPENDENT_AMBULATORY_CARE_PROVIDER_SITE_OTHER): Payer: Medicare Other | Admitting: Physician Assistant

## 2017-11-21 ENCOUNTER — Encounter: Payer: Self-pay | Admitting: Physician Assistant

## 2017-11-21 VITALS — BP 160/90 | HR 86 | Temp 98.4°F | Ht 67.0 in | Wt 156.4 lb

## 2017-11-21 DIAGNOSIS — E119 Type 2 diabetes mellitus without complications: Secondary | ICD-10-CM | POA: Diagnosis not present

## 2017-11-21 DIAGNOSIS — I10 Essential (primary) hypertension: Secondary | ICD-10-CM | POA: Diagnosis not present

## 2017-11-21 DIAGNOSIS — M1990 Unspecified osteoarthritis, unspecified site: Secondary | ICD-10-CM | POA: Insufficient documentation

## 2017-11-21 DIAGNOSIS — Z8619 Personal history of other infectious and parasitic diseases: Secondary | ICD-10-CM | POA: Insufficient documentation

## 2017-11-21 LAB — POCT GLUCOSE (DEVICE FOR HOME USE): POC GLUCOSE: 200 mg/dL — AB (ref 70–99)

## 2017-11-21 MED ORDER — BLOOD PRESSURE MONITOR AUTOMAT DEVI: 0 refills | Status: AC

## 2017-11-21 MED ORDER — ATORVASTATIN CALCIUM 40 MG PO TABS: 40.0000 mg | ORAL_TABLET | Freq: Every day | ORAL | 0 refills | Status: DC

## 2017-11-21 MED ORDER — GLUCOSE BLOOD VI STRP: ORAL_STRIP | 12 refills | Status: AC

## 2017-11-21 NOTE — Assessment & Plan Note (Signed)
He is not agreeable at this time to continue to titrate his insulin to fasting blood sugars less than 150. Continue current regimen. I did give him a brochure for Xutolphy (insulin + liraglutide) as I would like to transition him to this medication potentially at his next follow-up appointment. I requested that he follow-up with me next month so we can recheck his A1c and discuss a possible medication change. He is going to research this medication and check with his pharmacist to see how much it may cost. Alternatively, I told him that if he is not comfortable with any medication changes I would like for him to go to endocrinology for that and to provide further recommendations on his regimen. He is very reluctant to make any changes at this time. I also sent in test strips for him today.

## 2017-11-21 NOTE — Patient Instructions (Signed)
Continue to take your Lisinopril 10 mg daily. Please buy a blood pressure monitor and check your blood pressure daily. Please keep a log for [place] and bring with you at your next visit so we can review your blood pressures.   Please keep a log of your blood sugars and bring a copy with you at your next visit so we can review together.  I would like for you to consider at your next visit that we change you from insulin to [facility]. (I have given you a information card about this medication.) Another option would be that I send you to an endocrinologist if you are reluctant to make any changes.

## 2017-11-21 NOTE — Progress Notes (Signed)
Cory Maddox is a 72 y.o. male is here to discuss: Hypertension and Diabetes  I acted as a Education administrator for [employer], PA-C Anselmo Pickler, LPN  History of Present Illness:   Chief Complaint  Patient presents with  . Hypertension  . Diabetes    Hypertension  This is a chronic problem. The current episode started 1 to 4 weeks ago. Pertinent negatives include no anxiety, blurred vision, chest pain, headaches, peripheral edema, PND or shortness of breath. There are no associated agents to hypertension. Risk factors for coronary artery disease include diabetes mellitus and dyslipidemia. Past treatments include ACE inhibitors.  Diabetes  He presents for his follow-up diabetic visit. He has type 2 diabetes mellitus. His disease course has been worsening. Pertinent negatives for hypoglycemia include no headaches. Pertinent negatives for diabetes include no blurred vision and no chest pain. Risk factors for coronary artery disease include diabetes mellitus, hypertension, male sex and dyslipidemia. Current diabetic treatment includes oral agent (monotherapy) and insulin injections. He is compliant with treatment all of the time. His weight is stable. Diabetic current diet: No diet. When asked about meal planning, he reported none. He has not had a previous visit with a dietitian. He participates in exercise daily. His breakfast blood glucose is taken between 7-8 am. His breakfast blood glucose range is generally >200 mg/dl. (Average is 235) Eye exam is current.   In regards to his HTN, he has not been checking his blood pressure. Has not taken his blood pressure medication yet today. Denies any unusual side effects from the blood pressure medication.   Since he last saw me he has increased his insulin to 23 units daily. He is not comfortable increasing past this dosage. He reports that he knows his body very well and notices when he feels like he is "about to bottom out" will not take his afternoon  metformin 850 mg pill to prevent this.  Health Maintenance Due  Topic Date Due  . FOOT EXAM  05/10/1956  . COLONOSCOPY  05/10/1996    Past Medical History:  Diagnosis Date  . Arthritis   . Diabetes mellitus without complication ([place])   . History of shingles   . Hypertension      Social History   Socioeconomic History  . Marital status: Married    Spouse name: Not on file  . Number of children: Not on file  . Years of education: Not on file  . Highest education level: Not on file  Social Needs  . Financial resource strain: Not on file  . Food insecurity - worry: Not on file  . Food insecurity - inability: Not on file  . Transportation needs - medical: Not on file  . Transportation needs - non-medical: Not on file  Occupational History  . Not on file  Tobacco Use  . Smoking status: Never Smoker  . Smokeless tobacco: Never Used  Substance and Sexual Activity  . Alcohol use: Yes    Alcohol/week: 3.0 oz    Types: 5 Glasses of wine per week  . Drug use: No  . Sexual activity: Yes    Comment: married  Other Topics Concern  . Not on file  Social History Narrative   Married   Retired from [employer] in 2011   Loves to [place]    History reviewed. No pertinent surgical history.  Family History  Problem Relation Age of Onset  . Diabetes Mother     PMHx, SurgHx, SocialHx, FamHx, Medications, and Allergies were reviewed  in the Visit Navigator and updated as appropriate.   Patient Active Problem List   Diagnosis Date Noted  . Hypertension   . History of shingles   . Diabetes mellitus without complication ([place])   . Arthritis     Social History   Tobacco Use  . Smoking status: Never Smoker  . Smokeless tobacco: Never Used  Substance Use Topics  . Alcohol use: Yes    Alcohol/week: 3.0 oz    Types: 5 Glasses of wine per week  . Drug use: No    Current Medications and Allergies:    Current Outpatient Medications:  .  aspirin 81 MG tablet, Take 81 mg by mouth  daily., Disp: , Rfl:  .  atorvastatin (LIPITOR) 40 MG tablet, Take 1 tablet (40 mg total) by mouth daily., Disp: 90 tablet, Rfl: 0 .  B-D UF III MINI PEN NEEDLES 31G X 5 MM MISC, USE TO CHECK BLOOD SUGAR BID, Disp: , Rfl: 3 .  Blood Glucose Monitoring Suppl (ACCU-CHEK GUIDE) w/Device KIT, U UTD TO CHECK BLOOD SUGAR, Disp: , Rfl: 0 .  LANTUS SOLOSTAR 100 UNIT/ML Solostar Pen, Inject 21 Units into the skin daily. , Disp: , Rfl: 6 .  levocetirizine (XYZAL) 5 MG tablet, Take 5 mg by mouth daily., Disp: , Rfl: 4 .  lisinopril (PRINIVIL,ZESTRIL) 10 MG tablet, Take 1 tablet (10 mg total) by mouth daily., Disp: 30 tablet, Rfl: 1 .  meclizine (ANTIVERT) 32 MG tablet, Take 1 tablet (32 mg total) by mouth 3 (three) times daily as needed., Disp: 30 tablet, Rfl: 0 .  metFORMIN (GLUCOPHAGE) 850 MG tablet, Take 425-850 mg by mouth 2 (two) times daily with a meal. 850 in the morning and 425 in the evening, Disp: , Rfl:  .  Omega-3 Fatty Acids (FISH OIL) 1000 MG CPDR, Take 1 capsule by mouth daily., Disp: , Rfl:  .  pantoprazole (PROTONIX) 40 MG tablet, Take 40 mg by mouth daily., Disp: , Rfl: 2 .  Blood Pressure Monitoring (BLOOD PRESSURE MONITOR AUTOMAT) DEVI, TO CHECK BLOOD PRESSURE TWICE DAILY AS DIRECTED, Disp: 1 Device, Rfl: 0 .  glucose blood (ACCU-CHEK GUIDE) test strip, Use to check blood sugar twice a day and prn, Disp: 100 each, Rfl: 12  No Known Allergies  Review of Systems   Review of Systems  Eyes: Negative for blurred vision.  Respiratory: Negative for shortness of breath.   Cardiovascular: Negative for chest pain and PND.  Neurological: Negative for headaches.    Vitals:   Vitals:   11/21/17 0837  BP: (!) 160/90  Pulse: 86  Temp: 98.4 F (36.9 C)  TempSrc: Oral  SpO2: 97%  Weight: 156 lb 6.1 oz (70.9 kg)  Height: '5\' 7"'$  (1.702 m)     Body mass index is 24.49 kg/m.   Physical Exam:    Physical Exam  Constitutional: He appears well-developed. He is cooperative.  Non-toxic  appearance. He does not have a sickly appearance. He does not appear ill. No distress.  Cardiovascular: Normal rate, regular rhythm, S1 normal, S2 normal, normal heart sounds and normal pulses.  No LE edema  Pulmonary/Chest: Effort normal and breath sounds normal.  Neurological: He is alert. GCS eye subscore is 4. GCS verbal subscore is 5. GCS motor subscore is 6.  Skin: Skin is warm, dry and intact.  Psychiatric: He has a normal mood and affect. His speech is normal and behavior is normal.  Nursing note and vitals reviewed.    Assessment and Plan:  Duquan was seen today for hypertension and diabetes.  Problem List Items Addressed This Visit      Cardiovascular and Mediastinum   Hypertension    Continue lisinopril 10 mg. Patient is concerned that his medications been recalled. I asked that he follow-up with his pharmacist by now if this is true. I would like for him check his blood pressures approximately daily. I did give him a prescription or a blood pressure monitor. I did not guarantee that it would be covered. He is not having any symptoms. I will discuss blood pressure control at next follow-up visit in approximately one month.      Relevant Medications   atorvastatin (LIPITOR) 40 MG tablet     Endocrine   Diabetes mellitus without complication ([place]) - Primary    He is not agreeable at this time to continue to titrate his insulin to fasting blood sugars less than 150. Continue current regimen. I did give him a brochure for Xutolphy (insulin + liraglutide) as I would like to transition him to this medication potentially at his next follow-up appointment. I requested that he follow-up with me next month so we can recheck his A1c and discuss a possible medication change. He is going to research this medication and check with his pharmacist to see how much it may cost. Alternatively, I told him that if he is not comfortable with any medication changes I would like for him to go to  endocrinology for that and to provide further recommendations on his regimen. He is very reluctant to make any changes at this time. I also sent in test strips for him today.      Relevant Medications   atorvastatin (LIPITOR) 40 MG tablet   Other Relevant Orders   POCT Glucose (Device for Home Use) (Completed)       . Reviewed expectations re: course of current medical issues. . Discussed self-management of symptoms. . Outlined signs and symptoms indicating need for more acute intervention. . Patient verbalized understanding and all questions were answered. . See orders for this visit as documented in the electronic medical record. . Patient received an After Visit Summary.  CMA or LPN served as scribe during this visit. History, Physical, and Plan performed by medical provider. Documentation and orders reviewed and attested to.  Inda Coke, PA-C [place] [place] [facility] 11/21/2017  Follow-up: Return in about 4 weeks (around 12/17/2017) for DM and HTN f/u.

## 2017-11-21 NOTE — Assessment & Plan Note (Signed)
Continue lisinopril 10 mg. Patient is concerned that his medications been recalled. I asked that he follow-up with his pharmacist by now if this is true. I would like for him check his blood pressures approximately daily. I did give him a prescription or a blood pressure monitor. I did not guarantee that it would be covered. He is not having any symptoms. I will discuss blood pressure control at next follow-up visit in approximately one month.

## 2017-12-27 ENCOUNTER — Telehealth: Payer: Self-pay | Admitting: *Deleted

## 2017-12-27 NOTE — Telephone Encounter (Signed)
Spoke to pt, told him I was calling to remind him he needs a follow up. Pt said I am not coming back I am back with my old doctor. Asked pt why? He said he was not happy with the care and all the questions he will not be back. Told him okay I am sorry. Take care.

## 2018-01-03 DIAGNOSIS — J301 Allergic rhinitis due to pollen: Secondary | ICD-10-CM | POA: Diagnosis not present

## 2018-01-03 DIAGNOSIS — Z1211 Encounter for screening for malignant neoplasm of colon: Secondary | ICD-10-CM | POA: Diagnosis not present

## 2018-01-03 DIAGNOSIS — K219 Gastro-esophageal reflux disease without esophagitis: Secondary | ICD-10-CM | POA: Diagnosis not present

## 2018-01-03 DIAGNOSIS — E782 Mixed hyperlipidemia: Secondary | ICD-10-CM | POA: Diagnosis not present

## 2018-01-03 DIAGNOSIS — Z0000 Encounter for general adult medical examination without abnormal findings: Secondary | ICD-10-CM | POA: Diagnosis not present

## 2018-01-03 DIAGNOSIS — E1165 Type 2 diabetes mellitus with hyperglycemia: Secondary | ICD-10-CM | POA: Diagnosis not present

## 2018-01-03 DIAGNOSIS — I10 Essential (primary) hypertension: Secondary | ICD-10-CM | POA: Diagnosis not present

## 2018-04-19 DIAGNOSIS — M150 Primary generalized (osteo)arthritis: Secondary | ICD-10-CM | POA: Diagnosis not present

## 2018-04-19 DIAGNOSIS — J301 Allergic rhinitis due to pollen: Secondary | ICD-10-CM | POA: Diagnosis not present

## 2018-04-19 DIAGNOSIS — E1165 Type 2 diabetes mellitus with hyperglycemia: Secondary | ICD-10-CM | POA: Diagnosis not present

## 2018-04-19 DIAGNOSIS — E782 Mixed hyperlipidemia: Secondary | ICD-10-CM | POA: Diagnosis not present

## 2018-04-19 DIAGNOSIS — I10 Essential (primary) hypertension: Secondary | ICD-10-CM | POA: Diagnosis not present

## 2018-04-19 DIAGNOSIS — K219 Gastro-esophageal reflux disease without esophagitis: Secondary | ICD-10-CM | POA: Diagnosis not present

## 2018-07-26 DIAGNOSIS — Z794 Long term (current) use of insulin: Secondary | ICD-10-CM | POA: Diagnosis not present

## 2018-07-26 DIAGNOSIS — E1165 Type 2 diabetes mellitus with hyperglycemia: Secondary | ICD-10-CM | POA: Diagnosis not present

## 2018-07-26 DIAGNOSIS — I10 Essential (primary) hypertension: Secondary | ICD-10-CM | POA: Diagnosis not present

## 2018-07-26 DIAGNOSIS — E782 Mixed hyperlipidemia: Secondary | ICD-10-CM | POA: Diagnosis not present

## 2018-10-31 DIAGNOSIS — E782 Mixed hyperlipidemia: Secondary | ICD-10-CM | POA: Diagnosis not present

## 2018-10-31 DIAGNOSIS — Z7984 Long term (current) use of oral hypoglycemic drugs: Secondary | ICD-10-CM | POA: Diagnosis not present

## 2018-10-31 DIAGNOSIS — K219 Gastro-esophageal reflux disease without esophagitis: Secondary | ICD-10-CM | POA: Diagnosis not present

## 2018-10-31 DIAGNOSIS — I10 Essential (primary) hypertension: Secondary | ICD-10-CM | POA: Diagnosis not present

## 2018-10-31 DIAGNOSIS — E1165 Type 2 diabetes mellitus with hyperglycemia: Secondary | ICD-10-CM | POA: Diagnosis not present

## 2018-10-31 DIAGNOSIS — J301 Allergic rhinitis due to pollen: Secondary | ICD-10-CM | POA: Diagnosis not present

## 2019-02-06 DIAGNOSIS — Z794 Long term (current) use of insulin: Secondary | ICD-10-CM | POA: Diagnosis not present

## 2019-02-06 DIAGNOSIS — Z1211 Encounter for screening for malignant neoplasm of colon: Secondary | ICD-10-CM | POA: Diagnosis not present

## 2019-02-06 DIAGNOSIS — J301 Allergic rhinitis due to pollen: Secondary | ICD-10-CM | POA: Diagnosis not present

## 2019-02-06 DIAGNOSIS — I10 Essential (primary) hypertension: Secondary | ICD-10-CM | POA: Diagnosis not present

## 2019-02-06 DIAGNOSIS — E782 Mixed hyperlipidemia: Secondary | ICD-10-CM | POA: Diagnosis not present

## 2019-02-06 DIAGNOSIS — Z125 Encounter for screening for malignant neoplasm of prostate: Secondary | ICD-10-CM | POA: Diagnosis not present

## 2019-02-06 DIAGNOSIS — E1165 Type 2 diabetes mellitus with hyperglycemia: Secondary | ICD-10-CM | POA: Diagnosis not present

## 2019-02-06 DIAGNOSIS — K219 Gastro-esophageal reflux disease without esophagitis: Secondary | ICD-10-CM | POA: Diagnosis not present

## 2019-05-15 DIAGNOSIS — I10 Essential (primary) hypertension: Secondary | ICD-10-CM | POA: Diagnosis not present

## 2019-05-15 DIAGNOSIS — E1165 Type 2 diabetes mellitus with hyperglycemia: Secondary | ICD-10-CM | POA: Diagnosis not present

## 2019-05-15 DIAGNOSIS — E782 Mixed hyperlipidemia: Secondary | ICD-10-CM | POA: Diagnosis not present

## 2019-05-15 DIAGNOSIS — Z794 Long term (current) use of insulin: Secondary | ICD-10-CM | POA: Diagnosis not present

## 2019-05-15 DIAGNOSIS — K219 Gastro-esophageal reflux disease without esophagitis: Secondary | ICD-10-CM | POA: Diagnosis not present

## 2019-07-17 DIAGNOSIS — M150 Primary generalized (osteo)arthritis: Secondary | ICD-10-CM | POA: Diagnosis not present

## 2019-07-17 DIAGNOSIS — M19042 Primary osteoarthritis, left hand: Secondary | ICD-10-CM | POA: Diagnosis not present

## 2019-07-17 DIAGNOSIS — M19041 Primary osteoarthritis, right hand: Secondary | ICD-10-CM | POA: Diagnosis not present

## 2019-07-17 DIAGNOSIS — I10 Essential (primary) hypertension: Secondary | ICD-10-CM | POA: Diagnosis not present

## 2019-07-17 DIAGNOSIS — E1165 Type 2 diabetes mellitus with hyperglycemia: Secondary | ICD-10-CM | POA: Diagnosis not present

## 2019-07-17 DIAGNOSIS — E782 Mixed hyperlipidemia: Secondary | ICD-10-CM | POA: Diagnosis not present

## 2019-08-13 DIAGNOSIS — M150 Primary generalized (osteo)arthritis: Secondary | ICD-10-CM | POA: Diagnosis not present

## 2019-08-13 DIAGNOSIS — Z794 Long term (current) use of insulin: Secondary | ICD-10-CM | POA: Diagnosis not present

## 2019-08-13 DIAGNOSIS — E1165 Type 2 diabetes mellitus with hyperglycemia: Secondary | ICD-10-CM | POA: Diagnosis not present

## 2019-08-13 DIAGNOSIS — E782 Mixed hyperlipidemia: Secondary | ICD-10-CM | POA: Diagnosis not present

## 2019-08-13 DIAGNOSIS — M19041 Primary osteoarthritis, right hand: Secondary | ICD-10-CM | POA: Diagnosis not present

## 2019-08-13 DIAGNOSIS — M19042 Primary osteoarthritis, left hand: Secondary | ICD-10-CM | POA: Diagnosis not present

## 2019-08-13 DIAGNOSIS — I10 Essential (primary) hypertension: Secondary | ICD-10-CM | POA: Diagnosis not present

## 2019-08-13 DIAGNOSIS — N1831 Chronic kidney disease, stage 3a: Secondary | ICD-10-CM | POA: Diagnosis not present

## 2019-08-18 DIAGNOSIS — Z23 Encounter for immunization: Secondary | ICD-10-CM | POA: Diagnosis not present

## 2019-08-18 DIAGNOSIS — E1165 Type 2 diabetes mellitus with hyperglycemia: Secondary | ICD-10-CM | POA: Diagnosis not present

## 2019-09-10 DIAGNOSIS — Z794 Long term (current) use of insulin: Secondary | ICD-10-CM | POA: Diagnosis not present

## 2019-09-10 DIAGNOSIS — E1165 Type 2 diabetes mellitus with hyperglycemia: Secondary | ICD-10-CM | POA: Diagnosis not present

## 2019-09-25 DIAGNOSIS — Z20828 Contact with and (suspected) exposure to other viral communicable diseases: Secondary | ICD-10-CM | POA: Diagnosis not present

## 2019-10-15 DIAGNOSIS — M19041 Primary osteoarthritis, right hand: Secondary | ICD-10-CM | POA: Diagnosis not present

## 2019-10-15 DIAGNOSIS — I10 Essential (primary) hypertension: Secondary | ICD-10-CM | POA: Diagnosis not present

## 2019-10-15 DIAGNOSIS — M150 Primary generalized (osteo)arthritis: Secondary | ICD-10-CM | POA: Diagnosis not present

## 2019-10-15 DIAGNOSIS — E1165 Type 2 diabetes mellitus with hyperglycemia: Secondary | ICD-10-CM | POA: Diagnosis not present

## 2019-10-15 DIAGNOSIS — E782 Mixed hyperlipidemia: Secondary | ICD-10-CM | POA: Diagnosis not present

## 2019-10-15 DIAGNOSIS — M19042 Primary osteoarthritis, left hand: Secondary | ICD-10-CM | POA: Diagnosis not present

## 2019-10-29 DIAGNOSIS — M19041 Primary osteoarthritis, right hand: Secondary | ICD-10-CM | POA: Diagnosis not present

## 2019-10-29 DIAGNOSIS — I10 Essential (primary) hypertension: Secondary | ICD-10-CM | POA: Diagnosis not present

## 2019-10-29 DIAGNOSIS — M150 Primary generalized (osteo)arthritis: Secondary | ICD-10-CM | POA: Diagnosis not present

## 2019-10-29 DIAGNOSIS — E1165 Type 2 diabetes mellitus with hyperglycemia: Secondary | ICD-10-CM | POA: Diagnosis not present

## 2019-10-29 DIAGNOSIS — M19042 Primary osteoarthritis, left hand: Secondary | ICD-10-CM | POA: Diagnosis not present

## 2019-10-29 DIAGNOSIS — E782 Mixed hyperlipidemia: Secondary | ICD-10-CM | POA: Diagnosis not present

## 2019-11-11 DIAGNOSIS — Z794 Long term (current) use of insulin: Secondary | ICD-10-CM | POA: Diagnosis not present

## 2019-11-11 DIAGNOSIS — E1165 Type 2 diabetes mellitus with hyperglycemia: Secondary | ICD-10-CM | POA: Diagnosis not present

## 2019-11-11 DIAGNOSIS — E782 Mixed hyperlipidemia: Secondary | ICD-10-CM | POA: Diagnosis not present

## 2019-11-11 DIAGNOSIS — I10 Essential (primary) hypertension: Secondary | ICD-10-CM | POA: Diagnosis not present

## 2019-11-11 DIAGNOSIS — J301 Allergic rhinitis due to pollen: Secondary | ICD-10-CM | POA: Diagnosis not present

## 2019-11-11 DIAGNOSIS — K219 Gastro-esophageal reflux disease without esophagitis: Secondary | ICD-10-CM | POA: Diagnosis not present

## 2019-11-14 DIAGNOSIS — E1165 Type 2 diabetes mellitus with hyperglycemia: Secondary | ICD-10-CM | POA: Diagnosis not present

## 2019-11-24 DIAGNOSIS — E113293 Type 2 diabetes mellitus with mild nonproliferative diabetic retinopathy without macular edema, bilateral: Secondary | ICD-10-CM | POA: Diagnosis not present

## 2019-12-05 DIAGNOSIS — I10 Essential (primary) hypertension: Secondary | ICD-10-CM | POA: Diagnosis not present

## 2019-12-05 DIAGNOSIS — M150 Primary generalized (osteo)arthritis: Secondary | ICD-10-CM | POA: Diagnosis not present

## 2019-12-05 DIAGNOSIS — M19041 Primary osteoarthritis, right hand: Secondary | ICD-10-CM | POA: Diagnosis not present

## 2019-12-05 DIAGNOSIS — M19042 Primary osteoarthritis, left hand: Secondary | ICD-10-CM | POA: Diagnosis not present

## 2019-12-05 DIAGNOSIS — E1165 Type 2 diabetes mellitus with hyperglycemia: Secondary | ICD-10-CM | POA: Diagnosis not present

## 2019-12-05 DIAGNOSIS — Z7984 Long term (current) use of oral hypoglycemic drugs: Secondary | ICD-10-CM | POA: Diagnosis not present

## 2019-12-05 DIAGNOSIS — E782 Mixed hyperlipidemia: Secondary | ICD-10-CM | POA: Diagnosis not present

## 2019-12-16 DIAGNOSIS — E1165 Type 2 diabetes mellitus with hyperglycemia: Secondary | ICD-10-CM | POA: Diagnosis not present

## 2019-12-31 DIAGNOSIS — M19042 Primary osteoarthritis, left hand: Secondary | ICD-10-CM | POA: Diagnosis not present

## 2019-12-31 DIAGNOSIS — I10 Essential (primary) hypertension: Secondary | ICD-10-CM | POA: Diagnosis not present

## 2019-12-31 DIAGNOSIS — M150 Primary generalized (osteo)arthritis: Secondary | ICD-10-CM | POA: Diagnosis not present

## 2019-12-31 DIAGNOSIS — E1165 Type 2 diabetes mellitus with hyperglycemia: Secondary | ICD-10-CM | POA: Diagnosis not present

## 2019-12-31 DIAGNOSIS — Z7984 Long term (current) use of oral hypoglycemic drugs: Secondary | ICD-10-CM | POA: Diagnosis not present

## 2019-12-31 DIAGNOSIS — E782 Mixed hyperlipidemia: Secondary | ICD-10-CM | POA: Diagnosis not present

## 2019-12-31 DIAGNOSIS — M19041 Primary osteoarthritis, right hand: Secondary | ICD-10-CM | POA: Diagnosis not present

## 2020-02-10 DIAGNOSIS — E782 Mixed hyperlipidemia: Secondary | ICD-10-CM | POA: Diagnosis not present

## 2020-02-10 DIAGNOSIS — M19041 Primary osteoarthritis, right hand: Secondary | ICD-10-CM | POA: Diagnosis not present

## 2020-02-10 DIAGNOSIS — K219 Gastro-esophageal reflux disease without esophagitis: Secondary | ICD-10-CM | POA: Diagnosis not present

## 2020-02-10 DIAGNOSIS — E1165 Type 2 diabetes mellitus with hyperglycemia: Secondary | ICD-10-CM | POA: Diagnosis not present

## 2020-02-10 DIAGNOSIS — I10 Essential (primary) hypertension: Secondary | ICD-10-CM | POA: Diagnosis not present

## 2020-02-10 DIAGNOSIS — J301 Allergic rhinitis due to pollen: Secondary | ICD-10-CM | POA: Diagnosis not present

## 2020-02-13 DIAGNOSIS — E1165 Type 2 diabetes mellitus with hyperglycemia: Secondary | ICD-10-CM | POA: Diagnosis not present

## 2020-02-15 DIAGNOSIS — M19042 Primary osteoarthritis, left hand: Secondary | ICD-10-CM | POA: Diagnosis not present

## 2020-02-15 DIAGNOSIS — E782 Mixed hyperlipidemia: Secondary | ICD-10-CM | POA: Diagnosis not present

## 2020-02-15 DIAGNOSIS — E1165 Type 2 diabetes mellitus with hyperglycemia: Secondary | ICD-10-CM | POA: Diagnosis not present

## 2020-02-15 DIAGNOSIS — M19041 Primary osteoarthritis, right hand: Secondary | ICD-10-CM | POA: Diagnosis not present

## 2020-02-15 DIAGNOSIS — I10 Essential (primary) hypertension: Secondary | ICD-10-CM | POA: Diagnosis not present

## 2020-02-15 DIAGNOSIS — M150 Primary generalized (osteo)arthritis: Secondary | ICD-10-CM | POA: Diagnosis not present

## 2020-03-10 DIAGNOSIS — E1165 Type 2 diabetes mellitus with hyperglycemia: Secondary | ICD-10-CM | POA: Diagnosis not present

## 2020-03-10 DIAGNOSIS — M19042 Primary osteoarthritis, left hand: Secondary | ICD-10-CM | POA: Diagnosis not present

## 2020-03-10 DIAGNOSIS — M19041 Primary osteoarthritis, right hand: Secondary | ICD-10-CM | POA: Diagnosis not present

## 2020-03-10 DIAGNOSIS — I10 Essential (primary) hypertension: Secondary | ICD-10-CM | POA: Diagnosis not present

## 2020-03-10 DIAGNOSIS — E782 Mixed hyperlipidemia: Secondary | ICD-10-CM | POA: Diagnosis not present

## 2020-03-10 DIAGNOSIS — M150 Primary generalized (osteo)arthritis: Secondary | ICD-10-CM | POA: Diagnosis not present

## 2020-04-09 DIAGNOSIS — M19042 Primary osteoarthritis, left hand: Secondary | ICD-10-CM | POA: Diagnosis not present

## 2020-04-09 DIAGNOSIS — E782 Mixed hyperlipidemia: Secondary | ICD-10-CM | POA: Diagnosis not present

## 2020-04-09 DIAGNOSIS — I10 Essential (primary) hypertension: Secondary | ICD-10-CM | POA: Diagnosis not present

## 2020-04-09 DIAGNOSIS — M150 Primary generalized (osteo)arthritis: Secondary | ICD-10-CM | POA: Diagnosis not present

## 2020-04-09 DIAGNOSIS — E1165 Type 2 diabetes mellitus with hyperglycemia: Secondary | ICD-10-CM | POA: Diagnosis not present

## 2020-04-09 DIAGNOSIS — M19041 Primary osteoarthritis, right hand: Secondary | ICD-10-CM | POA: Diagnosis not present

## 2020-04-15 DIAGNOSIS — E1165 Type 2 diabetes mellitus with hyperglycemia: Secondary | ICD-10-CM | POA: Diagnosis not present

## 2020-05-17 DIAGNOSIS — E1165 Type 2 diabetes mellitus with hyperglycemia: Secondary | ICD-10-CM | POA: Diagnosis not present

## 2020-05-26 DIAGNOSIS — M79604 Pain in right leg: Secondary | ICD-10-CM | POA: Diagnosis not present

## 2020-05-27 DIAGNOSIS — E113293 Type 2 diabetes mellitus with mild nonproliferative diabetic retinopathy without macular edema, bilateral: Secondary | ICD-10-CM | POA: Diagnosis not present

## 2020-06-11 DIAGNOSIS — E782 Mixed hyperlipidemia: Secondary | ICD-10-CM | POA: Diagnosis not present

## 2020-06-11 DIAGNOSIS — M19042 Primary osteoarthritis, left hand: Secondary | ICD-10-CM | POA: Diagnosis not present

## 2020-06-11 DIAGNOSIS — I10 Essential (primary) hypertension: Secondary | ICD-10-CM | POA: Diagnosis not present

## 2020-06-11 DIAGNOSIS — E1165 Type 2 diabetes mellitus with hyperglycemia: Secondary | ICD-10-CM | POA: Diagnosis not present

## 2020-06-11 DIAGNOSIS — M150 Primary generalized (osteo)arthritis: Secondary | ICD-10-CM | POA: Diagnosis not present

## 2020-06-11 DIAGNOSIS — M19041 Primary osteoarthritis, right hand: Secondary | ICD-10-CM | POA: Diagnosis not present

## 2020-06-16 DIAGNOSIS — I10 Essential (primary) hypertension: Secondary | ICD-10-CM | POA: Diagnosis not present

## 2020-06-16 DIAGNOSIS — E782 Mixed hyperlipidemia: Secondary | ICD-10-CM | POA: Diagnosis not present

## 2020-06-16 DIAGNOSIS — K219 Gastro-esophageal reflux disease without esophagitis: Secondary | ICD-10-CM | POA: Diagnosis not present

## 2020-06-16 DIAGNOSIS — E1165 Type 2 diabetes mellitus with hyperglycemia: Secondary | ICD-10-CM | POA: Diagnosis not present

## 2020-06-16 DIAGNOSIS — Z794 Long term (current) use of insulin: Secondary | ICD-10-CM | POA: Diagnosis not present

## 2020-06-16 DIAGNOSIS — J301 Allergic rhinitis due to pollen: Secondary | ICD-10-CM | POA: Diagnosis not present

## 2020-06-23 DIAGNOSIS — I10 Essential (primary) hypertension: Secondary | ICD-10-CM | POA: Diagnosis not present

## 2020-06-23 DIAGNOSIS — M19041 Primary osteoarthritis, right hand: Secondary | ICD-10-CM | POA: Diagnosis not present

## 2020-06-23 DIAGNOSIS — E782 Mixed hyperlipidemia: Secondary | ICD-10-CM | POA: Diagnosis not present

## 2020-06-23 DIAGNOSIS — M150 Primary generalized (osteo)arthritis: Secondary | ICD-10-CM | POA: Diagnosis not present

## 2020-06-23 DIAGNOSIS — E1165 Type 2 diabetes mellitus with hyperglycemia: Secondary | ICD-10-CM | POA: Diagnosis not present

## 2020-06-23 DIAGNOSIS — M19042 Primary osteoarthritis, left hand: Secondary | ICD-10-CM | POA: Diagnosis not present

## 2020-07-22 DIAGNOSIS — E782 Mixed hyperlipidemia: Secondary | ICD-10-CM | POA: Diagnosis not present

## 2020-07-22 DIAGNOSIS — M150 Primary generalized (osteo)arthritis: Secondary | ICD-10-CM | POA: Diagnosis not present

## 2020-07-22 DIAGNOSIS — E1165 Type 2 diabetes mellitus with hyperglycemia: Secondary | ICD-10-CM | POA: Diagnosis not present

## 2020-07-22 DIAGNOSIS — I10 Essential (primary) hypertension: Secondary | ICD-10-CM | POA: Diagnosis not present

## 2020-07-22 DIAGNOSIS — K219 Gastro-esophageal reflux disease without esophagitis: Secondary | ICD-10-CM | POA: Diagnosis not present

## 2020-07-22 DIAGNOSIS — M19041 Primary osteoarthritis, right hand: Secondary | ICD-10-CM | POA: Diagnosis not present

## 2020-07-22 DIAGNOSIS — M19042 Primary osteoarthritis, left hand: Secondary | ICD-10-CM | POA: Diagnosis not present

## 2020-08-11 DIAGNOSIS — E1165 Type 2 diabetes mellitus with hyperglycemia: Secondary | ICD-10-CM | POA: Diagnosis not present

## 2020-09-07 ENCOUNTER — Ambulatory Visit
Admission: RE | Admit: 2020-09-07 | Discharge: 2020-09-07 | Disposition: A | Discharge status: Home / Self Care | Payer: Medicare Other | Source: Ambulatory Visit | Attending: Family Medicine | Admitting: Family Medicine

## 2020-09-07 ENCOUNTER — Other Ambulatory Visit: Payer: Self-pay | Admitting: Family Medicine

## 2020-09-07 DIAGNOSIS — S161XXA Strain of muscle, fascia and tendon at neck level, initial encounter: Secondary | ICD-10-CM

## 2020-09-07 DIAGNOSIS — M542 Cervicalgia: Secondary | ICD-10-CM | POA: Diagnosis not present

## 2020-09-16 DIAGNOSIS — E1165 Type 2 diabetes mellitus with hyperglycemia: Secondary | ICD-10-CM | POA: Diagnosis not present

## 2020-09-21 DIAGNOSIS — M542 Cervicalgia: Secondary | ICD-10-CM | POA: Diagnosis not present

## 2020-09-21 DIAGNOSIS — E1165 Type 2 diabetes mellitus with hyperglycemia: Secondary | ICD-10-CM | POA: Diagnosis not present

## 2020-09-21 DIAGNOSIS — I10 Essential (primary) hypertension: Secondary | ICD-10-CM | POA: Diagnosis not present

## 2020-09-21 DIAGNOSIS — E782 Mixed hyperlipidemia: Secondary | ICD-10-CM | POA: Diagnosis not present

## 2020-09-21 DIAGNOSIS — K219 Gastro-esophageal reflux disease without esophagitis: Secondary | ICD-10-CM | POA: Diagnosis not present

## 2020-09-21 DIAGNOSIS — J301 Allergic rhinitis due to pollen: Secondary | ICD-10-CM | POA: Diagnosis not present

## 2020-09-21 DIAGNOSIS — R229 Localized swelling, mass and lump, unspecified: Secondary | ICD-10-CM | POA: Diagnosis not present

## 2020-09-23 DIAGNOSIS — Z23 Encounter for immunization: Secondary | ICD-10-CM | POA: Diagnosis not present

## 2020-09-27 DIAGNOSIS — E1165 Type 2 diabetes mellitus with hyperglycemia: Secondary | ICD-10-CM | POA: Diagnosis not present

## 2020-09-27 DIAGNOSIS — S161XXD Strain of muscle, fascia and tendon at neck level, subsequent encounter: Secondary | ICD-10-CM | POA: Diagnosis not present

## 2020-09-27 DIAGNOSIS — I10 Essential (primary) hypertension: Secondary | ICD-10-CM | POA: Diagnosis not present

## 2020-10-04 DIAGNOSIS — M150 Primary generalized (osteo)arthritis: Secondary | ICD-10-CM | POA: Diagnosis not present

## 2020-10-04 DIAGNOSIS — E782 Mixed hyperlipidemia: Secondary | ICD-10-CM | POA: Diagnosis not present

## 2020-10-04 DIAGNOSIS — E1165 Type 2 diabetes mellitus with hyperglycemia: Secondary | ICD-10-CM | POA: Diagnosis not present

## 2020-10-04 DIAGNOSIS — I10 Essential (primary) hypertension: Secondary | ICD-10-CM | POA: Diagnosis not present

## 2020-10-04 DIAGNOSIS — M19041 Primary osteoarthritis, right hand: Secondary | ICD-10-CM | POA: Diagnosis not present

## 2020-10-04 DIAGNOSIS — M19042 Primary osteoarthritis, left hand: Secondary | ICD-10-CM | POA: Diagnosis not present

## 2020-10-04 DIAGNOSIS — K219 Gastro-esophageal reflux disease without esophagitis: Secondary | ICD-10-CM | POA: Diagnosis not present

## 2020-10-17 DIAGNOSIS — E1165 Type 2 diabetes mellitus with hyperglycemia: Secondary | ICD-10-CM | POA: Diagnosis not present

## 2020-10-18 DIAGNOSIS — E1165 Type 2 diabetes mellitus with hyperglycemia: Secondary | ICD-10-CM | POA: Diagnosis not present

## 2020-11-22 ENCOUNTER — Other Ambulatory Visit: Payer: Self-pay

## 2020-11-24 ENCOUNTER — Other Ambulatory Visit: Payer: Self-pay

## 2020-11-24 ENCOUNTER — Ambulatory Visit (INDEPENDENT_AMBULATORY_CARE_PROVIDER_SITE_OTHER): Payer: Medicare Other | Admitting: Endocrinology

## 2020-11-24 VITALS — BP 152/74 | HR 78 | Ht 67.0 in | Wt 152.0 lb

## 2020-11-24 DIAGNOSIS — E119 Type 2 diabetes mellitus without complications: Secondary | ICD-10-CM | POA: Diagnosis not present

## 2020-11-24 LAB — POCT GLYCOSYLATED HEMOGLOBIN (HGB A1C): Hemoglobin A1C: 9.6 % — AB (ref 4.0–5.6)

## 2020-11-24 MED ORDER — INSULIN ISOPHANE HUMAN 100 UNIT/ML KWIKPEN: 21.0000 [IU] | PEN_INJECTOR | SUBCUTANEOUS | 11 refills | Status: DC

## 2020-11-24 NOTE — Patient Instructions (Addendum)
Your blood pressure is high today.  Please see your primary care provider soon, to have it rechecked.   good diet and exercise significantly improve the control of your diabetes.  please let me know if you wish to be referred to a dietician.  high blood sugar is very risky to your health.  you should see an eye doctor and dentist every year.  It is very important to get all recommended vaccinations.  Controlling your blood pressure and cholesterol drastically reduces the damage diabetes does to your body.  Those who smoke should quit.  Please discuss these with your doctor.  check your blood sugar twice a day.  vary the time of day when you check, between before the 3 meals, and at bedtime.  also check if you have symptoms of your blood sugar being too high or too low.  please keep a record of the readings and bring it to your next appointment here (or you can bring the meter itself).  You can write it on any piece of paper.  please call us sooner if your blood sugar goes below 70, or if most of your readings are over 200. I have sent a prescription to your pharmacy, to change Lantus to NPH, at the same 21 units each morning. On this type of insulin schedule, you should eat meals on a regular schedule (especially lunch).  If a meal is missed or significantly delayed, your blood sugar could go low.   Please come back for a follow-up appointment in 1 month.

## 2020-11-24 NOTE — Progress Notes (Signed)
Subjective:    Patient ID: Cory Maddox, male    DOB: Oct 17, 1945, 75 y.o.   MRN: [id number]  HPI pt is referred by Dr Kenton Kingfisher, for diabetes.  Pt states DM was dx'ed in 0998; it is complicated by DR; he has been on insulin since 2012; pt says his diet and exercise are good; he has never had pancreatitis, pancreatic surgery, or DKA.  He has had 1 episode of severe hypoglycemia (2000).  He takes Lantus 15 units QAM.  He says cbg varies from 42-270.  It is in general higher as the day goes on.  He has mild hypoglycemia approx QOD--always fasting.    Past Medical History:  Diagnosis Date  . Arthritis   . Diabetes mellitus without complication ([place])   . History of shingles   . Hypertension     No past surgical history on file.  Social History   Socioeconomic History  . Marital status: Married    Spouse name: Not on file  . Number of children: Not on file  . Years of education: Not on file  . Highest education level: Not on file  Occupational History  . Not on file  Tobacco Use  . Smoking status: Never Smoker  . Smokeless tobacco: Never Used  Vaping Use  . Vaping Use: Never used  Substance and Sexual Activity  . Alcohol use: Yes    Alcohol/week: 5.0 standard drinks    Types: 5 Glasses of wine per week  . Drug use: No  . Sexual activity: Yes    Comment: married  Other Topics Concern  . Not on file  Social History Narrative   Married   Retired from [employer] in 2011   Loves to [place] Strain: Not on [employer] Insecurity: Not on file  Transportation Needs: Not on file  Physical Activity: Not on file  Stress: Not on file  Social Connections: Not on file  Intimate Partner Violence: Not on file    Current Outpatient Medications on File Prior to Visit  Medication Sig Dispense Refill  . aspirin 81 MG tablet Take 81 mg by mouth daily.    Marland Kitchen atorvastatin (LIPITOR) 40 MG tablet Take 1 tablet (40 mg total) by mouth daily. 90  tablet 0  . B-D UF III MINI PEN NEEDLES 31G X 5 MM MISC USE TO CHECK BLOOD SUGAR BID  3  . Blood Glucose Monitoring Suppl (ACCU-CHEK GUIDE) w/Device KIT U UTD TO CHECK BLOOD SUGAR  0  . Blood Pressure Monitoring (BLOOD PRESSURE MONITOR AUTOMAT) DEVI TO CHECK BLOOD PRESSURE TWICE DAILY AS DIRECTED 1 Device 0  . glucose blood (ACCU-CHEK GUIDE) test strip Use to check blood sugar twice a day and prn 100 each 12  . levocetirizine (XYZAL) 5 MG tablet Take 5 mg by mouth daily.  4  . lisinopril (PRINIVIL,ZESTRIL) 10 MG tablet Take 1 tablet (10 mg total) by mouth daily. 30 tablet 1  . meclizine (ANTIVERT) 32 MG tablet Take 1 tablet (32 mg total) by mouth 3 (three) times daily as needed. 30 tablet 0  . metFORMIN (GLUCOPHAGE) 850 MG tablet Take 425-850 mg by mouth 2 (two) times daily with a meal. 850 in the morning and 425 in the evening    . Omega-3 Fatty Acids (FISH OIL) 1000 MG CPDR Take 1 capsule by mouth daily.    . pantoprazole (PROTONIX) 40 MG tablet Take 40 mg by mouth daily.  2  No current facility-administered medications on file prior to visit.    No Known Allergies  Family History  Problem Relation Age of Onset  . Diabetes Mother     BP (!) 152/74 (BP Location: Right Arm, Patient Position: Sitting, Cuff Size: Normal)   Pulse 78   Ht $R'5\' 7"'MN$  (1.702 m)   Wt 152 lb (68.9 kg)   SpO2 99%   BMI 23.81 kg/m     Review of Systems denies weight loss, blurry vision, chest pain, sob, n/v, urinary frequency, memory loss, and depression.       Objective:   Physical Exam VITAL SIGNS:  See vs page GENERAL: no distress Pulses: dorsalis pedis intact bilat.   MSK: no deformity of the feet CV: no leg edema Skin:  no ulcer on the feet.  normal color and temp on the feet. Neuro: sensation is intact to touch on the feet  Lab Results  Component Value Date   CREATININE 1.10 11/07/2017   BUN 15 11/07/2017   NA 135 11/07/2017   K 4.4 11/07/2017   CL 100 11/07/2017   CO2 29 11/07/2017     A1c=9.6%  I have reviewed outside records, and summarized: Pt was noted to have elevated A1c, and referred here.  HTN and neck pain were also addressed.        Assessment & Plan:  Insulin-requiring type 2 DM, with DR: uncontrolled.  he declines multiple daily injections.   HTN: is noted today.   Patient Instructions  Your blood pressure is high today.  Please see your primary care provider soon, to have it rechecked.   good diet and exercise significantly improve the control of your diabetes.  please let me know if you wish to be referred to a dietician.  high blood sugar is very risky to your health.  you should see an eye doctor and dentist every year.  It is very important to get all recommended vaccinations.  Controlling your blood pressure and cholesterol drastically reduces the damage diabetes does to your body.  Those who smoke should quit.  Please discuss these with your doctor.  check your blood sugar twice a day.  vary the time of day when you check, between before the 3 meals, and at bedtime.  also check if you have symptoms of your blood sugar being too high or too low.  please keep a record of the readings and bring it to your next appointment here (or you can bring the meter itself).  You can write it on any piece of paper.  please call us sooner if your blood sugar goes below 70, or if most of your readings are over 200. I have sent a prescription to your pharmacy, to change Lantus to NPH, at the same 21 units each morning. On this type of insulin schedule, you should eat meals on a regular schedule (especially lunch).  If a meal is missed or significantly delayed, your blood sugar could go low.   Please come back for a follow-up appointment in 1 month.

## 2020-11-29 ENCOUNTER — Other Ambulatory Visit: Payer: Self-pay

## 2020-11-30 DIAGNOSIS — E113293 Type 2 diabetes mellitus with mild nonproliferative diabetic retinopathy without macular edema, bilateral: Secondary | ICD-10-CM | POA: Diagnosis not present

## 2020-12-07 DIAGNOSIS — R195 Other fecal abnormalities: Secondary | ICD-10-CM | POA: Diagnosis not present

## 2020-12-07 DIAGNOSIS — K5900 Constipation, unspecified: Secondary | ICD-10-CM | POA: Diagnosis not present

## 2020-12-07 DIAGNOSIS — I10 Essential (primary) hypertension: Secondary | ICD-10-CM | POA: Diagnosis not present

## 2020-12-20 ENCOUNTER — Ambulatory Visit: Payer: Medicare Other | Admitting: Endocrinology

## 2020-12-28 ENCOUNTER — Ambulatory Visit: Payer: Medicare Other | Admitting: Endocrinology

## 2020-12-29 DIAGNOSIS — M19042 Primary osteoarthritis, left hand: Secondary | ICD-10-CM | POA: Diagnosis not present

## 2020-12-29 DIAGNOSIS — E1165 Type 2 diabetes mellitus with hyperglycemia: Secondary | ICD-10-CM | POA: Diagnosis not present

## 2020-12-29 DIAGNOSIS — I10 Essential (primary) hypertension: Secondary | ICD-10-CM | POA: Diagnosis not present

## 2020-12-29 DIAGNOSIS — M150 Primary generalized (osteo)arthritis: Secondary | ICD-10-CM | POA: Diagnosis not present

## 2020-12-29 DIAGNOSIS — E782 Mixed hyperlipidemia: Secondary | ICD-10-CM | POA: Diagnosis not present

## 2020-12-29 DIAGNOSIS — K219 Gastro-esophageal reflux disease without esophagitis: Secondary | ICD-10-CM | POA: Diagnosis not present

## 2020-12-29 DIAGNOSIS — M19041 Primary osteoarthritis, right hand: Secondary | ICD-10-CM | POA: Diagnosis not present

## 2021-02-11 DIAGNOSIS — E1165 Type 2 diabetes mellitus with hyperglycemia: Secondary | ICD-10-CM | POA: Diagnosis not present

## 2021-02-15 DIAGNOSIS — E1165 Type 2 diabetes mellitus with hyperglycemia: Secondary | ICD-10-CM | POA: Diagnosis not present

## 2021-02-15 DIAGNOSIS — E782 Mixed hyperlipidemia: Secondary | ICD-10-CM | POA: Diagnosis not present

## 2021-02-15 DIAGNOSIS — D171 Benign lipomatous neoplasm of skin and subcutaneous tissue of trunk: Secondary | ICD-10-CM | POA: Diagnosis not present

## 2021-02-15 DIAGNOSIS — K219 Gastro-esophageal reflux disease without esophagitis: Secondary | ICD-10-CM | POA: Diagnosis not present

## 2021-02-15 DIAGNOSIS — J301 Allergic rhinitis due to pollen: Secondary | ICD-10-CM | POA: Diagnosis not present

## 2021-02-15 DIAGNOSIS — I10 Essential (primary) hypertension: Secondary | ICD-10-CM | POA: Diagnosis not present

## 2021-03-04 DIAGNOSIS — E1165 Type 2 diabetes mellitus with hyperglycemia: Secondary | ICD-10-CM | POA: Diagnosis not present

## 2021-04-01 DIAGNOSIS — D171 Benign lipomatous neoplasm of skin and subcutaneous tissue of trunk: Secondary | ICD-10-CM | POA: Diagnosis not present

## 2021-04-01 DIAGNOSIS — E119 Type 2 diabetes mellitus without complications: Secondary | ICD-10-CM | POA: Diagnosis not present

## 2021-04-01 DIAGNOSIS — I10 Essential (primary) hypertension: Secondary | ICD-10-CM | POA: Diagnosis not present

## 2021-04-15 DIAGNOSIS — H47011 Ischemic optic neuropathy, right eye: Secondary | ICD-10-CM | POA: Diagnosis not present

## 2021-04-20 DIAGNOSIS — U071 COVID-19: Secondary | ICD-10-CM | POA: Diagnosis not present

## 2021-04-22 DIAGNOSIS — Z20822 Contact with and (suspected) exposure to covid-19: Secondary | ICD-10-CM | POA: Diagnosis not present

## 2021-05-11 DIAGNOSIS — E113291 Type 2 diabetes mellitus with mild nonproliferative diabetic retinopathy without macular edema, right eye: Secondary | ICD-10-CM | POA: Diagnosis not present

## 2021-05-11 DIAGNOSIS — H2513 Age-related nuclear cataract, bilateral: Secondary | ICD-10-CM | POA: Diagnosis not present

## 2021-05-11 DIAGNOSIS — E113212 Type 2 diabetes mellitus with mild nonproliferative diabetic retinopathy with macular edema, left eye: Secondary | ICD-10-CM | POA: Diagnosis not present

## 2021-05-11 DIAGNOSIS — H43821 Vitreomacular adhesion, right eye: Secondary | ICD-10-CM | POA: Diagnosis not present

## 2021-06-23 DIAGNOSIS — C476 Malignant neoplasm of peripheral nerves of trunk, unspecified: Secondary | ICD-10-CM | POA: Diagnosis not present

## 2021-06-23 DIAGNOSIS — C496 Malignant neoplasm of connective and soft tissue of trunk, unspecified: Secondary | ICD-10-CM | POA: Diagnosis not present

## 2021-07-18 ENCOUNTER — Other Ambulatory Visit: Payer: Self-pay | Admitting: General Surgery

## 2021-07-18 DIAGNOSIS — C496 Malignant neoplasm of connective and soft tissue of trunk, unspecified: Secondary | ICD-10-CM | POA: Diagnosis not present

## 2021-07-19 ENCOUNTER — Other Ambulatory Visit (HOSPITAL_COMMUNITY): Payer: Self-pay | Admitting: General Surgery

## 2021-07-19 ENCOUNTER — Other Ambulatory Visit: Payer: Self-pay | Admitting: General Surgery

## 2021-07-19 DIAGNOSIS — C496 Malignant neoplasm of connective and soft tissue of trunk, unspecified: Secondary | ICD-10-CM

## 2021-07-21 ENCOUNTER — Ambulatory Visit (HOSPITAL_COMMUNITY)
Admission: RE | Admit: 2021-07-21 | Discharge: 2021-07-21 | Disposition: A | Discharge status: Home / Self Care | Payer: Medicare Other | Source: Ambulatory Visit | Attending: General Surgery | Admitting: General Surgery

## 2021-07-21 ENCOUNTER — Other Ambulatory Visit: Payer: Self-pay

## 2021-07-21 DIAGNOSIS — C496 Malignant neoplasm of connective and soft tissue of trunk, unspecified: Secondary | ICD-10-CM | POA: Diagnosis not present

## 2021-07-21 LAB — POCT I-STAT CREATININE: Creatinine, Ser: 1.3 mg/dL — ABNORMAL HIGH (ref 0.61–1.24)

## 2021-07-21 MED ORDER — IOHEXOL 350 MG/ML SOLN
65.0000 mL | Freq: Once | INTRAVENOUS | Status: AC | PRN
  Administered 2021-07-21: 65 mL via INTRAVENOUS

## 2021-07-25 ENCOUNTER — Other Ambulatory Visit: Payer: Self-pay

## 2021-07-25 ENCOUNTER — Ambulatory Visit (HOSPITAL_COMMUNITY)
Admission: RE | Admit: 2021-07-25 | Discharge: 2021-07-25 | Disposition: A | Discharge status: Home / Self Care | Payer: Medicare Other | Source: Ambulatory Visit | Attending: General Surgery | Admitting: General Surgery

## 2021-07-25 DIAGNOSIS — J301 Allergic rhinitis due to pollen: Secondary | ICD-10-CM | POA: Diagnosis not present

## 2021-07-25 DIAGNOSIS — C499 Malignant neoplasm of connective and soft tissue, unspecified: Secondary | ICD-10-CM | POA: Diagnosis not present

## 2021-07-25 DIAGNOSIS — C496 Malignant neoplasm of connective and soft tissue of trunk, unspecified: Secondary | ICD-10-CM | POA: Diagnosis not present

## 2021-07-25 DIAGNOSIS — E1165 Type 2 diabetes mellitus with hyperglycemia: Secondary | ICD-10-CM | POA: Diagnosis not present

## 2021-07-25 DIAGNOSIS — E782 Mixed hyperlipidemia: Secondary | ICD-10-CM | POA: Diagnosis not present

## 2021-07-25 DIAGNOSIS — K219 Gastro-esophageal reflux disease without esophagitis: Secondary | ICD-10-CM | POA: Diagnosis not present

## 2021-07-25 DIAGNOSIS — I10 Essential (primary) hypertension: Secondary | ICD-10-CM | POA: Diagnosis not present

## 2021-07-25 MED ORDER — GADOBUTROL 1 MMOL/ML IV SOLN
7.5000 mL | Freq: Once | INTRAVENOUS | Status: AC | PRN
  Administered 2021-07-25: 7.5 mL via INTRAVENOUS

## 2021-07-25 NOTE — Progress Notes (Addendum)
Surgical Instructions    Your procedure is scheduled on Wednesday, November 9th, 2022.   Report to [facility] Main Entrance "A" at 11:00 P.M., then check in with the Admitting office.  Call this number if you have problems the morning of surgery:  [phone]   If you have any questions prior to your surgery date call [phone]: Open Monday-Friday 8am-4pm    Remember:  Do not eat after midnight the night before your surgery  You may drink clear liquids until 10:00 the day of your surgery.   Clear liquids allowed are: Water, Non-Citrus Juices (without pulp), Carbonated Beverages, Clear Tea, Black Coffee ONLY (NO MILK, CREAM OR POWDERED CREAMER of any kind), and Gatorade    Take these medicines the morning of surgery with A SIP OF WATER:  pantoprazole (PROTONIX)    Follow your surgeon's instructions on when to stop Aspirin.  If no instructions were given by your surgeon then you will need to call the office to get those instructions.     As of today, STOP taking any Aspirin (unless otherwise instructed by your surgeon) Aleve, Naproxen, Ibuprofen, Motrin, Advil, Goody's, BC's, all herbal medications, fish oil, and all vitamins.    WHAT DO I DO ABOUT MY DIABETES MEDICATION?   Do not take glipiZIDE (GLUCOTROL) and  metFORMIN (GLUCOPHAGE) the morning of surgery.  THE NIGHT BEFORE SURGERY, take 7 units of insulin glargine (LANTUS) 50 % of your regular dose (if you are taking Lantus at night)  OR  THE MORNING OF SURGERY, take 7 units of insulin glargine (LANTUS) 50 % of your regular dose (if you are taking Lantus in the morning)    HOW TO MANAGE YOUR DIABETES BEFORE AND AFTER SURGERY  Why is it important to control my blood sugar before and after surgery? Improving blood sugar levels before and after surgery helps healing and can limit problems. A way of improving blood sugar control is eating a healthy diet by:  Eating less sugar and carbohydrates  Increasing  activity/exercise  Talking with your doctor about reaching your blood sugar goals High blood sugars (greater than 180 mg/dL) can raise your risk of infections and slow your recovery, so you will need to focus on controlling your diabetes during the weeks before surgery. Make sure that the doctor who takes care of your diabetes knows about your planned surgery including the date and location.  How do I manage my blood sugar before surgery? Check your blood sugar at least 4 times a day, starting 2 days before surgery, to make sure that the level is not too high or low.  Check your blood sugar the morning of your surgery when you wake up and every 2 hours until you get to the Short Stay unit.  If your blood sugar is less than 70 mg/dL, you will need to treat for low blood sugar: Do not take insulin. Treat a low blood sugar (less than 70 mg/dL) with  cup of clear juice (cranberry or apple), 4 glucose tablets, OR glucose gel. Recheck blood sugar in 15 minutes after treatment (to make sure it is greater than 70 mg/dL). If your blood sugar is not greater than 70 mg/dL on recheck, call [phone] for further instructions. Report your blood sugar to the short stay nurse when you get to Short Stay.  If you are admitted to the hospital after surgery: Your blood sugar will be checked by the staff and you will probably be given insulin after surgery (instead of oral  diabetes medicines) to make sure you have good blood sugar levels. The goal for blood sugar control after surgery is 80-180 mg/dL.     After your COVID test   You are not required to quarantine however you are required to wear a well-fitting mask when you are out and around people not in your household.  If your mask becomes wet or soiled, replace with a new one.  Wash your hands often with soap and water for 20 seconds or clean your hands with an alcohol-based hand sanitizer that contains at least 60% alcohol.  Do not share personal  items.  Notify your provider: if you are in close contact with someone who has COVID  or if you develop a fever of 100.4 or greater, sneezing, cough, sore throat, shortness of breath or body aches.    The day of surgery:          Do not wear jewelry  Do not wear lotions, powders, colognes, or deodorant. Men may shave face and neck. Do not bring valuables to the hospital.              [facility] is not responsible for any belongings or valuables.  Do NOT Smoke (Tobacco/Vaping)  24 hours prior to your procedure  If you use a CPAP at night, you may bring your mask for your overnight stay.   Contacts, glasses, hearing aids, dentures or partials may not be worn into surgery, please bring cases for these belongings   For patients admitted to the hospital, discharge time will be determined by your treatment team.   Patients discharged the day of surgery will not be allowed to drive home, and someone needs to stay with them for 24 hours.  NO VISITORS WILL BE ALLOWED IN PRE-OP WHERE PATIENTS ARE PREPPED FOR SURGERY.  ONLY 1 SUPPORT PERSON MAY BE PRESENT IN THE WAITING ROOM WHILE YOU ARE IN SURGERY.  IF YOU ARE TO BE ADMITTED, ONCE YOU ARE IN YOUR ROOM YOU WILL BE ALLOWED TWO (2) VISITORS. 1 (ONE) VISITOR MAY STAY OVERNIGHT BUT MUST ARRIVE TO THE ROOM BY 8pm.  Minor children may have two parents present. Special consideration for safety and communication needs will be reviewed on a case by case basis.  Special instructions:    Oral Hygiene is also important to reduce your risk of infection.  Remember - BRUSH YOUR TEETH THE MORNING OF SURGERY WITH YOUR REGULAR TOOTHPASTE   [place]- Preparing For Surgery  Before surgery, you can play an important role. Because skin is not sterile, your skin needs to be as free of germs as possible. You can reduce the number of germs on your skin by washing with CHG (chlorahexidine gluconate) Soap before surgery.  CHG is an antiseptic cleaner which kills  germs and bonds with the skin to continue killing germs even after washing.     Please do not use if you have an allergy to CHG or antibacterial soaps. If your skin becomes reddened/irritated stop using the CHG.  Do not shave (including legs and underarms) for at least 48 hours prior to first CHG shower. It is OK to shave your face.  Please follow these instructions carefully.     Shower the NIGHT BEFORE SURGERY and the MORNING OF SURGERY with CHG Soap.   If you chose to wash your hair, wash your hair first as usual with your normal shampoo. After you shampoo, rinse your hair and body thoroughly to remove the shampoo.  Then  Wash Face and genitals (private parts) with your normal soap and rinse thoroughly to remove soap.  After that Use CHG Soap as you would any other liquid soap. You can apply CHG directly to the skin and wash gently with a scrungie or a clean washcloth.   Apply the CHG Soap to your body ONLY FROM THE NECK DOWN.  Do not use on open wounds or open sores. Avoid contact with your eyes, ears, mouth and genitals (private parts). Wash Face and genitals (private parts)  with your normal soap.   Wash thoroughly, paying special attention to the area where your surgery will be performed.  Thoroughly rinse your body with warm water from the neck down.  DO NOT shower/wash with your normal soap after using and rinsing off the CHG Soap.  Pat yourself dry with a CLEAN TOWEL.  Wear CLEAN PAJAMAS to bed the night before surgery  Place CLEAN SHEETS on your bed the night before your surgery  DO NOT SLEEP WITH PETS.   Day of Surgery:  Take a shower with CHG soap. Wear Clean/Comfortable clothing the morning of surgery Do not apply any deodorants/lotions.   Remember to brush your teeth WITH YOUR REGULAR TOOTHPASTE.   Please read over the following fact sheets that you were given.

## 2021-07-26 ENCOUNTER — Other Ambulatory Visit: Payer: Self-pay

## 2021-07-26 ENCOUNTER — Encounter (HOSPITAL_COMMUNITY)
Admission: RE | Admit: 2021-07-26 | Discharge: 2021-07-26 | Disposition: A | Discharge status: Home / Self Care | Payer: Medicare Other | Source: Ambulatory Visit | Attending: General Surgery | Admitting: General Surgery

## 2021-07-26 ENCOUNTER — Encounter (HOSPITAL_COMMUNITY): Payer: Self-pay

## 2021-07-26 VITALS — BP 172/88 | HR 75 | Temp 98.5°F | Resp 17 | Ht 67.0 in | Wt 151.8 lb

## 2021-07-26 DIAGNOSIS — Z01818 Encounter for other preprocedural examination: Secondary | ICD-10-CM | POA: Diagnosis not present

## 2021-07-26 DIAGNOSIS — Z79899 Other long term (current) drug therapy: Secondary | ICD-10-CM | POA: Diagnosis not present

## 2021-07-26 HISTORY — DX: Gastro-esophageal reflux disease without esophagitis: K21.9

## 2021-07-26 LAB — CBC
HCT: 38 % — ABNORMAL LOW (ref 39.0–52.0)
Hemoglobin: 12.2 g/dL — ABNORMAL LOW (ref 13.0–17.0)
MCH: 28.5 pg (ref 26.0–34.0)
MCHC: 32.1 g/dL (ref 30.0–36.0)
MCV: 88.8 fL (ref 80.0–100.0)
Platelets: 406 10*3/uL — ABNORMAL HIGH (ref 150–400)
RBC: 4.28 MIL/uL (ref 4.22–5.81)
RDW: 11.9 % (ref 11.5–15.5)
WBC: 7.5 10*3/uL (ref 4.0–10.5)
nRBC: 0 % (ref 0.0–0.2)

## 2021-07-26 LAB — BASIC METABOLIC PANEL
Anion gap: 10 (ref 5–15)
BUN: 15 mg/dL (ref 8–23)
CO2: 26 mmol/L (ref 22–32)
Calcium: 8.9 mg/dL (ref 8.9–10.3)
Chloride: 99 mmol/L (ref 98–111)
Creatinine, Ser: 1.31 mg/dL — ABNORMAL HIGH (ref 0.61–1.24)
GFR, Estimated: 57 mL/min — ABNORMAL LOW (ref >60)
Glucose, Bld: 246 mg/dL — ABNORMAL HIGH (ref 70–99)
Potassium: 4.8 mmol/L (ref 3.5–5.1)
Sodium: 135 mmol/L (ref 135–145)

## 2021-07-26 LAB — GLUCOSE, CAPILLARY: Glucose-Capillary: 292 mg/dL — ABNORMAL HIGH (ref 70–99)

## 2021-07-26 NOTE — Progress Notes (Addendum)
PCP - Dr. Shirline Frees, MD Cardiologist - denies  PPM/ICD - denies Device Orders - n/a Rep Notified - n/a  Chest x-ray - n/a EKG - 07/26/2021 Stress Test - denies ECHO - denies Cardiac Cath - denies  Sleep Study - denies CPAP - n/a  Fasting Blood Sugar - 50-125 Checks Blood Sugar once a day CBG today - 292 A1C - done in PCP office this week - records requested  Blood Thinner Instructions: n/a  Aspirin Instructions: Aspirin - last dose on 07/22/2021  Patient was instructed: As of today, STOP taking any Aspirin (unless otherwise instructed by your surgeon) Aleve, Naproxen, Ibuprofen, Motrin, Advil, Goody's, BC's, all herbal medications, fish oil, and all vitamins.  ERAS Protcol - yes PRE-SURGERY Ensure or G2- no  COVID TEST- no - ambulatory surgery   Anesthesia review: Karoline Caldwell PA-C was notified about BP in PAT elevated - 172/88 - patient verbalized that he didn't take his morning medication. When recheck, BP 192/86 CBG elevated in PAT - patient verbalized he didn't take his diabetes medication this AM and he ate a small donut and he had a coffee.  Patient was instructed to go home and take his morning medication and check more frequent his BP and CBG today. If BP or CBG will continue to be elevated patient was instructed to call his PCP. Patient verbalized understanding.    Patient denies shortness of breath, fever, cough and chest pain at PAT appointment   All instructions explained to the patient, with a verbal understanding of the material. Patient agrees to go over the instructions while at home for a better understanding. Patient also instructed to self quarantine after being tested for COVID-19. The opportunity to ask questions was provided.

## 2021-07-27 ENCOUNTER — Other Ambulatory Visit: Payer: Self-pay

## 2021-07-27 ENCOUNTER — Ambulatory Visit (HOSPITAL_COMMUNITY): Payer: Medicare Other | Admitting: Physician Assistant

## 2021-07-27 ENCOUNTER — Encounter (HOSPITAL_COMMUNITY): Payer: Self-pay | Admitting: General Surgery

## 2021-07-27 ENCOUNTER — Encounter (HOSPITAL_COMMUNITY)
Admission: RE | Disposition: A | Discharge status: Home / Self Care | Payer: Self-pay | Source: Ambulatory Visit | Attending: General Surgery

## 2021-07-27 ENCOUNTER — Ambulatory Visit (HOSPITAL_COMMUNITY)
Admission: RE | Admit: 2021-07-27 | Discharge: 2021-07-27 | Disposition: A | Discharge status: Home / Self Care | Payer: Medicare Other | Source: Ambulatory Visit | Attending: General Surgery | Admitting: General Surgery

## 2021-07-27 ENCOUNTER — Ambulatory Visit (HOSPITAL_COMMUNITY): Payer: Medicare Other | Admitting: Anesthesiology

## 2021-07-27 DIAGNOSIS — Z794 Long term (current) use of insulin: Secondary | ICD-10-CM | POA: Diagnosis not present

## 2021-07-27 DIAGNOSIS — C496 Malignant neoplasm of connective and soft tissue of trunk, unspecified: Secondary | ICD-10-CM | POA: Diagnosis not present

## 2021-07-27 DIAGNOSIS — R222 Localized swelling, mass and lump, trunk: Secondary | ICD-10-CM | POA: Diagnosis present

## 2021-07-27 DIAGNOSIS — C760 Malignant neoplasm of head, face and neck: Secondary | ICD-10-CM | POA: Diagnosis not present

## 2021-07-27 DIAGNOSIS — I10 Essential (primary) hypertension: Secondary | ICD-10-CM | POA: Diagnosis not present

## 2021-07-27 DIAGNOSIS — E119 Type 2 diabetes mellitus without complications: Secondary | ICD-10-CM | POA: Diagnosis not present

## 2021-07-27 DIAGNOSIS — Z7984 Long term (current) use of oral hypoglycemic drugs: Secondary | ICD-10-CM | POA: Diagnosis not present

## 2021-07-27 HISTORY — PX: MASS EXCISION: SHX2000

## 2021-07-27 LAB — GLUCOSE, CAPILLARY
Glucose-Capillary: 195 mg/dL — ABNORMAL HIGH (ref 70–99)
Glucose-Capillary: 136 mg/dL — ABNORMAL HIGH (ref 70–99)

## 2021-07-27 SURGERY — EXCISION MASS
Anesthesia: General | Site: Back | Laterality: Right

## 2021-07-27 MED ORDER — PHENYLEPHRINE HCL-NACL 20-0.9 MG/250ML-% IV SOLN
INTRAVENOUS | Status: DC | PRN
  Administered 2021-07-27: 25 ug/min via INTRAVENOUS

## 2021-07-27 MED ORDER — LIDOCAINE 2% (20 MG/ML) 5 ML SYRINGE
INTRAMUSCULAR | Status: AC
  Filled 2021-07-27: qty 5

## 2021-07-27 MED ORDER — SUCCINYLCHOLINE CHLORIDE 200 MG/10ML IV SOSY
PREFILLED_SYRINGE | INTRAVENOUS | Status: AC
  Filled 2021-07-27: qty 20

## 2021-07-27 MED ORDER — ROCURONIUM BROMIDE 100 MG/10ML IV SOLN
INTRAVENOUS | Status: DC | PRN
  Administered 2021-07-27: 60 mg via INTRAVENOUS

## 2021-07-27 MED ORDER — OXYCODONE HCL 5 MG PO TABS: 5.0000 mg | ORAL_TABLET | Freq: Four times a day (QID) | ORAL | 0 refills | Status: DC | PRN

## 2021-07-27 MED ORDER — DEXAMETHASONE SODIUM PHOSPHATE 4 MG/ML IJ SOLN
INTRAMUSCULAR | Status: DC | PRN
  Administered 2021-07-27: 4 mg via INTRAVENOUS

## 2021-07-27 MED ORDER — BUPIVACAINE LIPOSOME 1.3 % IJ SUSP
INTRAMUSCULAR | Status: AC
  Filled 2021-07-27: qty 20

## 2021-07-27 MED ORDER — ROCURONIUM BROMIDE 10 MG/ML (PF) SYRINGE
PREFILLED_SYRINGE | INTRAVENOUS | Status: AC
  Filled 2021-07-27: qty 20

## 2021-07-27 MED ORDER — GABAPENTIN 100 MG PO CAPS
100.0000 mg | ORAL_CAPSULE | ORAL | Status: AC
  Administered 2021-07-27: 100 mg via ORAL
  Filled 2021-07-27: qty 1

## 2021-07-27 MED ORDER — SUGAMMADEX SODIUM 200 MG/2ML IV SOLN
INTRAVENOUS | Status: DC | PRN
  Administered 2021-07-27: 140 mg via INTRAVENOUS

## 2021-07-27 MED ORDER — ORAL CARE MOUTH RINSE: 15.0000 mL | Freq: Once | OROMUCOSAL | Status: AC

## 2021-07-27 MED ORDER — PHENYLEPHRINE 40 MCG/ML (10ML) SYRINGE FOR IV PUSH (FOR BLOOD PRESSURE SUPPORT)
PREFILLED_SYRINGE | INTRAVENOUS | Status: AC
  Filled 2021-07-27: qty 20

## 2021-07-27 MED ORDER — BUPIVACAINE LIPOSOME 1.3 % IJ SUSP
20.0000 mL | Freq: Once | INTRAMUSCULAR | Status: DC
  Filled 2021-07-27: qty 20

## 2021-07-27 MED ORDER — BUPIVACAINE LIPOSOME 1.3 % IJ SUSP
INTRAMUSCULAR | Status: DC | PRN
  Administered 2021-07-27: 20 mL

## 2021-07-27 MED ORDER — LIDOCAINE HCL (CARDIAC) PF 100 MG/5ML IV SOSY
PREFILLED_SYRINGE | INTRAVENOUS | Status: DC | PRN
  Administered 2021-07-27: 60 mg via INTRAVENOUS

## 2021-07-27 MED ORDER — CHLORHEXIDINE GLUCONATE CLOTH 2 % EX PADS: 6.0000 | MEDICATED_PAD | Freq: Once | CUTANEOUS | Status: DC

## 2021-07-27 MED ORDER — 0.9 % SODIUM CHLORIDE (POUR BTL) OPTIME
TOPICAL | Status: DC | PRN
  Administered 2021-07-27: 1000 mL

## 2021-07-27 MED ORDER — MIDAZOLAM HCL 2 MG/2ML IJ SOLN
INTRAMUSCULAR | Status: AC
  Filled 2021-07-27: qty 2

## 2021-07-27 MED ORDER — EPHEDRINE 5 MG/ML INJ
INTRAVENOUS | Status: AC
  Filled 2021-07-27: qty 5

## 2021-07-27 MED ORDER — MIDAZOLAM HCL 5 MG/5ML IJ SOLN
INTRAMUSCULAR | Status: DC | PRN
  Administered 2021-07-27: 1 mg via INTRAVENOUS

## 2021-07-27 MED ORDER — DEXAMETHASONE SODIUM PHOSPHATE 10 MG/ML IJ SOLN
INTRAMUSCULAR | Status: AC
  Filled 2021-07-27: qty 2

## 2021-07-27 MED ORDER — CEFAZOLIN SODIUM-DEXTROSE 2-4 GM/100ML-% IV SOLN
2.0000 g | INTRAVENOUS | Status: AC
  Administered 2021-07-27: 2 g via INTRAVENOUS
  Filled 2021-07-27: qty 100

## 2021-07-27 MED ORDER — CHLORHEXIDINE GLUCONATE 0.12 % MT SOLN
15.0000 mL | Freq: Once | OROMUCOSAL | Status: AC
  Administered 2021-07-27: 15 mL via OROMUCOSAL
  Filled 2021-07-27: qty 15

## 2021-07-27 MED ORDER — ONDANSETRON HCL 4 MG/2ML IJ SOLN: 4.0000 mg | Freq: Once | INTRAMUSCULAR | Status: DC | PRN

## 2021-07-27 MED ORDER — FENTANYL CITRATE (PF) 100 MCG/2ML IJ SOLN: 25.0000 ug | INTRAMUSCULAR | Status: DC | PRN

## 2021-07-27 MED ORDER — ONDANSETRON HCL 4 MG/2ML IJ SOLN
INTRAMUSCULAR | Status: AC
  Filled 2021-07-27: qty 4

## 2021-07-27 MED ORDER — LIDOCAINE HCL (PF) 1 % IJ SOLN
INTRAMUSCULAR | Status: AC
  Filled 2021-07-27: qty 30

## 2021-07-27 MED ORDER — KETOROLAC TROMETHAMINE 30 MG/ML IJ SOLN
INTRAMUSCULAR | Status: AC
  Filled 2021-07-27: qty 1

## 2021-07-27 MED ORDER — ONDANSETRON HCL 4 MG/2ML IJ SOLN
INTRAMUSCULAR | Status: DC | PRN
  Administered 2021-07-27: 4 mg via INTRAVENOUS

## 2021-07-27 MED ORDER — ARTIFICIAL TEARS OPHTHALMIC OINT
TOPICAL_OINTMENT | OPHTHALMIC | Status: AC
  Filled 2021-07-27: qty 3.5

## 2021-07-27 MED ORDER — LIDOCAINE HCL 1 % IJ SOLN
INTRAMUSCULAR | Status: DC | PRN
  Administered 2021-07-27: 30 mL

## 2021-07-27 MED ORDER — ACETAMINOPHEN 500 MG PO TABS
1000.0000 mg | ORAL_TABLET | ORAL | Status: AC
  Administered 2021-07-27: 1000 mg via ORAL
  Filled 2021-07-27: qty 2

## 2021-07-27 MED ORDER — OXYCODONE HCL 5 MG/5ML PO SOLN: 5.0000 mg | Freq: Once | ORAL | Status: DC | PRN

## 2021-07-27 MED ORDER — PROPOFOL 10 MG/ML IV BOLUS
INTRAVENOUS | Status: AC
  Filled 2021-07-27: qty 20

## 2021-07-27 MED ORDER — LACTATED RINGERS IV SOLN: INTRAVENOUS | Status: DC

## 2021-07-27 MED ORDER — FENTANYL CITRATE (PF) 250 MCG/5ML IJ SOLN
INTRAMUSCULAR | Status: AC
  Filled 2021-07-27: qty 5

## 2021-07-27 MED ORDER — FENTANYL CITRATE (PF) 100 MCG/2ML IJ SOLN
INTRAMUSCULAR | Status: DC | PRN
  Administered 2021-07-27: 100 ug via INTRAVENOUS
  Administered 2021-07-27: 50 ug via INTRAVENOUS

## 2021-07-27 MED ORDER — GABAPENTIN 100 MG PO CAPS: 100.0000 mg | ORAL_CAPSULE | Freq: Two times a day (BID) | ORAL | 1 refills | Status: DC

## 2021-07-27 MED ORDER — BUPIVACAINE-EPINEPHRINE (PF) 0.25% -1:200000 IJ SOLN
INTRAMUSCULAR | Status: AC
  Filled 2021-07-27: qty 30

## 2021-07-27 MED ORDER — OXYCODONE HCL 5 MG PO TABS: 5.0000 mg | ORAL_TABLET | Freq: Once | ORAL | Status: DC | PRN

## 2021-07-27 MED ORDER — DIPHENHYDRAMINE HCL 50 MG/ML IJ SOLN
INTRAMUSCULAR | Status: AC
  Filled 2021-07-27: qty 1

## 2021-07-27 MED ORDER — PROPOFOL 10 MG/ML IV BOLUS
INTRAVENOUS | Status: DC | PRN
  Administered 2021-07-27: 150 mg via INTRAVENOUS

## 2021-07-27 MED ORDER — METHOCARBAMOL 500 MG PO TABS: 500.0000 mg | ORAL_TABLET | Freq: Four times a day (QID) | ORAL | 1 refills | Status: DC | PRN

## 2021-07-27 SURGICAL SUPPLY — 44 items
ADH SKN CLS APL DERMABOND .7 (GAUZE/BANDAGES/DRESSINGS) ×1
APL PRP STRL LF DISP 70% ISPRP (MISCELLANEOUS) ×1
APL SKNCLS STERI-STRIP NONHPOA (GAUZE/BANDAGES/DRESSINGS) ×1
BAG COUNTER SPONGE SURGICOUNT (BAG) ×2 IMPLANT
BAG SPNG CNTER NS LX DISP (BAG) ×1
BENZOIN TINCTURE PRP APPL 2/3 (GAUZE/BANDAGES/DRESSINGS) ×2 IMPLANT
BIOPATCH RED 1 DISK 7.0 (GAUZE/BANDAGES/DRESSINGS) ×2 IMPLANT
CANISTER SUCT 3000ML PPV (MISCELLANEOUS) ×2 IMPLANT
CHLORAPREP W/TINT 26 (MISCELLANEOUS) ×2 IMPLANT
CLIP TI LARGE 6 (CLIP) ×4 IMPLANT
COVER SURGICAL LIGHT HANDLE (MISCELLANEOUS) ×2 IMPLANT
DERMABOND ADVANCED (GAUZE/BANDAGES/DRESSINGS) ×1
DERMABOND ADVANCED .7 DNX12 (GAUZE/BANDAGES/DRESSINGS) ×1 IMPLANT
DRSG TEGADERM 4X4.75 (GAUZE/BANDAGES/DRESSINGS) ×4 IMPLANT
ELECT REM PT RETURN 9FT ADLT (ELECTROSURGICAL) ×2
ELECTRODE REM PT RTRN 9FT ADLT (ELECTROSURGICAL) ×1 IMPLANT
EVACUATOR 3/16  PVC DRAIN (DRAIN) ×2
EVACUATOR 3/16 PVC DRAIN (DRAIN) ×1 IMPLANT
GAUZE SPONGE 4X4 12PLY STRL (GAUZE/BANDAGES/DRESSINGS) IMPLANT
GLOVE SURG ENC MOIS LTX SZ6 (GLOVE) ×2 IMPLANT
GLOVE SURG UNDER LTX SZ6.5 (GLOVE) ×2 IMPLANT
GOWN STRL REUS W/ TWL LRG LVL3 (GOWN DISPOSABLE) ×1 IMPLANT
GOWN STRL REUS W/TWL 2XL LVL3 (GOWN DISPOSABLE) ×2 IMPLANT
GOWN STRL REUS W/TWL LRG LVL3 (GOWN DISPOSABLE) ×2
KIT BASIN OR (CUSTOM PROCEDURE TRAY) ×2 IMPLANT
KIT TURNOVER KIT B (KITS) ×2 IMPLANT
NEEDLE HYPO 25GX1X1/2 BEV (NEEDLE) ×2 IMPLANT
NS IRRIG 1000ML POUR BTL (IV SOLUTION) ×2 IMPLANT
PACK GENERAL/GYN (CUSTOM PROCEDURE TRAY) ×2 IMPLANT
PAD ARMBOARD 7.5X6 YLW CONV (MISCELLANEOUS) ×4 IMPLANT
PENCIL SMOKE EVACUATOR (MISCELLANEOUS) ×2 IMPLANT
SHEARS HARMONIC 9CM CVD (BLADE) ×2 IMPLANT
SPONGE T-LAP 18X18 ~~LOC~~+RFID (SPONGE) ×4 IMPLANT
STRIP CLOSURE SKIN 1/2X4 (GAUZE/BANDAGES/DRESSINGS) IMPLANT
SUT ETHILON 2 0 FS 18 (SUTURE) ×2 IMPLANT
SUT ETHILON 2 0 PSLX (SUTURE) ×2 IMPLANT
SUT MON AB 4-0 PC3 18 (SUTURE) ×2 IMPLANT
SUT SILK 2 0 PERMA HAND 18 BK (SUTURE) IMPLANT
SUT VIC AB 3-0 SH 27 (SUTURE) ×2
SUT VIC AB 3-0 SH 27X BRD (SUTURE) ×1 IMPLANT
SUT VIC AB 3-0 SH 8-18 (SUTURE) ×2 IMPLANT
SYR CONTROL 10ML LL (SYRINGE) ×2 IMPLANT
TOWEL GREEN STERILE (TOWEL DISPOSABLE) ×2 IMPLANT
TOWEL GREEN STERILE FF (TOWEL DISPOSABLE) ×2 IMPLANT

## 2021-07-27 NOTE — Anesthesia Procedure Notes (Signed)
Procedure Name: Intubation Date/Time: 07/27/2021 1:09 PM Performed by: Wilburn Cornelia, CRNA Pre-anesthesia Checklist: Patient identified, Emergency Drugs available, Suction available, Patient being monitored and Timeout performed Patient Re-evaluated:Patient Re-evaluated prior to induction Oxygen Delivery Method: Circle system utilized Preoxygenation: Pre-oxygenation with 100% oxygen Induction Type: IV induction Ventilation: Mask ventilation without difficulty Laryngoscope Size: Mac and 4 Grade View: Grade IV Tube type: Oral Tube size: 7.5 mm Number of attempts: 1 Placement Confirmation: ETT inserted through vocal cords under direct vision, positive ETCO2, CO2 detector and breath sounds checked- equal and bilateral Secured at: 22 cm Tube secured with: Tape Dental Injury: Teeth and Oropharynx as per pre-operative assessment

## 2021-07-27 NOTE — Op Note (Signed)
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  FINDINGS:  firm mass right back medial and inferior to scapula  EBL: 25 mL  PROCEDURE:  Patient was identified in the holding area and then taken to the operating room where he was intubated on the stretcher.  He was then flipped onto the operating room table in the prone position.  Appropriate padding was placed.  Arms were positioned appropriately to avoid any tension on the shoulder joint or the brachial plexus.  The right upper back was prepped and draped in sterile fashion.  A timeout was then performed according to the surgical safety checklist.  When all was correct, we continued.  A elliptical incision was made incorporating the previous incision from the biopsy of the mass.  A portion of skin over the central area of the mass was excised en bloc.  Local anesthetic was infiltrated into the skin.  Cautery was used to open the dermis and then skin hooks were used to elevate the skin edges to facilitate dissection around the mass.  Once I got down to the fascia, the fascia was opened with the cautery.  The harmonic scalpel was used to divide the muscle fibers that were adherent to the mass.  The deep margin of the resection was a nice fascial layer.   Several clips were used to assist with hemostasis.  Once the mass was taken out, care was  taken to orient the specimen appropriately.  This was passed off for pathology.  The cavity was then irrigated.  Large metal clips were placed around the cavity to identify the width of the excision for potential radiation if needed.  A 15 French Hemovac drain was placed into the cavity and secured with a 2-0 nylon..  The skin was then closed using interrupted 3-0 Vicryl deep dermal sutures and running 4-0 Monocryl subcuticular suture.  2 vertical mattress sutures of 2-0 nylon were placed in the center of the incision to minimize any tension.  The skin was then cleaned, dried, and dressed with benzoin, Steri-Strips, gauze, and Tegaderm.  The patient was flipped back supine and allowed to emerge from anesthesia.  He was then taken to the PACU in stable condition.

## 2021-07-27 NOTE — Transfer of Care (Signed)
Immediate Anesthesia Transfer of Care Note  Patient: Cory Maddox  Procedure(s) Performed: RADICAL EXCISION OF MALIGNANT MASS RIGHT BACK (Right: Back)  Patient Location: PACU  Anesthesia Type:General  Level of Consciousness: awake, alert  and oriented  Airway & Oxygen Therapy: Patient Spontanous Breathing and Patient connected to nasal cannula oxygen  Post-op Assessment: Report given to RN and Post -op Vital signs reviewed and stable  Post vital signs: Reviewed and stable  Last Vitals:  Vitals Value Taken Time  BP 151/73   Temp    Pulse 69 07/27/21 1532  Resp 13 07/27/21 1532  SpO2 99 % 07/27/21 1532  Vitals shown include unvalidated device data.  Last Pain:  Vitals:   07/27/21 1123  TempSrc:   PainSc: 3          Complications: No notable events documented.

## 2021-07-27 NOTE — Anesthesia Postprocedure Evaluation (Addendum)
Anesthesia Post Note  Patient: Cory Maddox  Procedure(s) Performed: RADICAL EXCISION OF MALIGNANT MASS RIGHT BACK (Right: Back)     Patient location during evaluation: PACU Anesthesia Type: General Level of consciousness: awake and alert and oriented Pain management: pain level controlled Vital Signs Assessment: post-procedure vital signs reviewed and stable Respiratory status: spontaneous breathing, nonlabored ventilation and respiratory function stable Cardiovascular status: blood pressure returned to baseline and stable Postop Assessment: no apparent nausea or vomiting Anesthetic complications: no   No notable events documented.  Last Vitals:  Vitals:   07/27/21 1548 07/27/21 1603  BP: (!) 156/70 (!) 165/81  Pulse: 68 70  Resp: 17 15  Temp:  (!) 36.3 C  SpO2: 100% 100%    Last Pain:  Vitals:   07/27/21 1603  TempSrc:   PainSc: 3                  Deziyah Arvin A.

## 2021-07-27 NOTE — Discharge Instructions (Addendum)
[place] Office Phone Number [phone]   POST OP INSTRUCTIONS  Always review your discharge instruction sheet given to you by the facility where your surgery was performed.  IF YOU HAVE DISABILITY OR FAMILY LEAVE FORMS, YOU MUST BRING THEM TO THE OFFICE FOR PROCESSING.  DO NOT GIVE THEM TO YOUR DOCTOR.  A prescription for pain medication may be given to you upon discharge.  Take your pain medication as prescribed, if needed.  If narcotic pain medicine is not needed, then you may take acetaminophen (Tylenol) or ibuprofen (Advil) as needed. Take your usually prescribed medications unless otherwise directed If you need a refill on your pain medication, please contact your pharmacy.  They will contact our office to request authorization.  Prescriptions will not be filled after 5pm or on week-ends. You should eat very light the first 24 hours after surgery, such as soup, crackers, pudding, etc.  Resume your normal diet the day after surgery It is common to experience some constipation if taking pain medication after surgery.  Increasing fluid intake and taking a stool softener will usually help or prevent this problem from occurring.  A mild laxative (Milk of Magnesia or Miralax) should be taken according to package directions if there are no bowel movements after 48 hours. You may shower in 48 hours.  You may take off the outer dressing after that.  The surgical glue or strips will flake off in 2-3 weeks.   ACTIVITIES:  No strenuous activity or heavy lifting for 2-4 weeks after seeing your doctor.   You may drive when you no longer are taking prescription pain medication, you can comfortably wear a seatbelt, and you can safely maneuver your car and apply brakes. RETURN TO WORK:  __________as tolerated if no lifting.  _______________ Cory Maddox should see your doctor in the office for a follow-up appointment approximately three-four weeks after your surgery.    WHEN TO CALL YOUR  DOCTOR: Fever over 101.0 Nausea and/or vomiting. Extreme swelling or bruising. Continued bleeding from incision. Increased pain, redness, or drainage from the incision.  The clinic staff is available to answer your questions during regular business hours.  Please don't hesitate to call and ask to speak to one of the nurses for clinical concerns.  If you have a medical emergency, go to the nearest emergency room or call 911.  A surgeon from [facility] is always on call at the hospital.  For further questions, please visit [email or web address]

## 2021-07-27 NOTE — Interval H&P Note (Signed)
History and Physical Interval Note:  07/27/2021 12:43 PM  Cory Maddox  has presented today for surgery, with the diagnosis of Strang.  The various methods of treatment have been discussed with the patient and family. After consideration of risks, benefits and other options for treatment, the patient has consented to  Procedure(s): RADICAL EXCISION OF MALIGNANT MASS RIGHT BACK (Right) as a surgical intervention.  The patient's history has been reviewed, patient examined, no change in status, stable for surgery.  I have reviewed the patient's chart and labs.  Questions were answered to the patient's satisfaction.     Stark Klein

## 2021-07-27 NOTE — Anesthesia Preprocedure Evaluation (Addendum)
Anesthesia Evaluation  Patient identified by MRN, date of birth, ID band Patient awake    Reviewed: Allergy & Precautions, NPO status , Patient's Chart, lab work & pertinent test results  Airway Mallampati: II  TM Distance: >3 FB Neck ROM: Full    Dental no notable dental hx.    Pulmonary neg pulmonary ROS,    Pulmonary exam normal        Cardiovascular hypertension, Pt. on medications  Rhythm:Regular Rate:Normal     Neuro/Psych negative neurological ROS  negative psych ROS   GI/Hepatic Neg liver ROS, GERD  Medicated,  Endo/Other  diabetes, Type 2, Oral Hypoglycemic Agents, Insulin DependentFS 195  Renal/GU negative Renal ROS  negative genitourinary   Musculoskeletal  (+) Arthritis , Osteoarthritis,  Right back malignant sarcoma    Abdominal Normal abdominal exam  (+)   Peds  Hematology negative hematology ROS (+)   Anesthesia Other Findings   Reproductive/Obstetrics                            Anesthesia Physical Anesthesia Plan  ASA: 2  Anesthesia Plan: General   Post-op Pain Management:    Induction: Intravenous  PONV Risk Score and Plan: 2 and Ondansetron, Dexamethasone and Treatment may vary due to age or medical condition  Airway Management Planned: Mask and Oral ETT  Additional Equipment: None  Intra-op Plan:   Post-operative Plan: Extubation in OR  Informed Consent: I have reviewed the patients History and Physical, chart, labs and discussed the procedure including the risks, benefits and alternatives for the proposed anesthesia with the patient or authorized representative who has indicated his/her understanding and acceptance.     Dental advisory given  Plan Discussed with: CRNA  Anesthesia Plan Comments: (Lab Results      Component                Value               Date                      WBC                      7.5                 07/26/2021                 HGB                      12.2 (L)            07/26/2021                HCT                      38.0 (L)            07/26/2021                MCV                      88.8                07/26/2021                PLT  406 (H)             07/26/2021           Lab Results      Component                Value               Date                      NA                       135                 07/26/2021                K                        4.8                 07/26/2021                CO2                      26                  07/26/2021                GLUCOSE                  246 (H)             07/26/2021                BUN                      15                  07/26/2021                CREATININE               1.31 (H)            07/26/2021                CALCIUM                  8.9                 07/26/2021                GFRNONAA                 57 (L)              07/26/2021          )        Anesthesia Quick Evaluation

## 2021-07-27 NOTE — H&P (Signed)
PROVIDER: Georgianne Fick, MD  MRN: [id number] DOB: 29-Oct-1945 DATE OF ENCOUNTER: 07/18/2021 Subjective   Chief Complaint: new problem   History of Present Illness: Cory Maddox is a 75 y.o. male who is seen today for right back sarcoma.  Patient was first seen by Dr. Redmond Pulling in July 2022 with a mass that had been present for around 3 months. He is not sure if it was caused by him scratching the area with his back scratcher. It caused some discomfort if he laid on it like for example when trying to sleep. He denied any weight loss. He denied any lymphadenopathy. He denied any family history of soft tissue cancers. He denied any injury to the area. He denied any drainage or redness to the area. He denied any other soft tissue lumps or bumps. Denied any cardiac history. He has diabetes and is on insulin. No tobacco use. He is quite active and plays golf 3 times a week.  Interval history: Patient underwent open incisional biopsy by Dr. 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 He is referred to me for definitive surgical tx. He did not have much post op pain. His wife does think this has gotten a bit larger.   Review of Systems: A complete review of systems was obtained from the patient. I have reviewed this information and discussed as appropriate with the patient. See HPI as well for other ROS.  Review of Systems  All other systems reviewed and are negative.   Medical History: Past Medical History:  Diagnosis Date   Arthritis   Diabetes mellitus without complication (CMS-HCC)   Hypertension   Patient Active Problem List  Diagnosis   Arthritis   Diabetes mellitus without complication (CMS-HCC)   Hypertension   Sarcoma of back (CMS-HCC)   History reviewed. No pertinent surgical history.   No Known Allergies  Current Outpatient Medications on File Prior to Visit  Medication Sig Dispense Refill   glipiZIDE (GLUCOTROL) 5 MG tablet  glipizide 5 mg tablet   insulin GLARGINE (LANTUS SOLOSTAR) pen injector (concentration 100 units/mL) Lantus Solostar U-100 Insulin 100 unit/mL (3 mL) subcutaneous pen INJECT 10 TO 15 UNITS UNDER THE SKIN ONCE DAILY   levocetirizine (XYZAL) 5 MG tablet levocetirizine 5 mg tablet TAKE 1 TABLET BY MOUTH EVERY DAY IN THE EVENING   lisinopriL (ZESTRIL) 20 MG tablet lisinopril 20 mg tablet TAKE 1 TABLET BY MOUTH EVERY DAY   metFORMIN (GLUCOPHAGE) 850 MG tablet metformin 850 mg tablet TAKE 1 TABLET BY MOUTH TWICE DAILY   pantoprazole (PROTONIX) 20 MG DR tablet pantoprazole 20 mg tablet,delayed release TAKE 1 TABLET BY MOUTH EVERY DAY   No current facility-administered medications on file prior to visit.   Family History  Problem Relation Age of Onset   High blood pressure (Hypertension) Mother   Diabetes Mother    Social History   Tobacco Use  Smoking Status Never Smoker  Smokeless Tobacco Never Used    Social History   Socioeconomic History   Marital status: Unknown  Tobacco Use   Smoking status: Never Smoker   Smokeless tobacco: Never Used  Substance and Sexual Activity   Alcohol use: Never   Drug use: Never   Sexual activity: Defer   Objective:   Vitals:  07/18/21 1146  BP: (!) 122/98  Pulse: 70  Temp: 36.6 C (97.9 F)  SpO2: 95%  Weight: 69 kg (152 lb 3.2 oz)  Height: 170.2 cm (5\' 7" )  Body mass index is 23.84 kg/m.  Physical Exam Vitals reviewed.    Head: Normocephalic and atraumatic.  Eyes: Conjunctivae are normal. Pupils are equal, round, and reactive to light. No scleral icterus.  Neck: Normal range of motion. Neck supple. No tracheal deviation present. No thyromegaly present.  Resp: No respiratory distress, normal effort. Abd: Abdomen is soft, non distended and non tender. No masses are palpable. There is no rebound and no guarding.  Neurological: Alert and oriented to person, place, and time. Coordination normal.  Skin: Skin is warm and dry. No rash  noted. No diaphoretic. No erythema. No pallor. There is a 10x12 cm mass on his right back around the level of the lower edge of the scapula but more medial. It does not move with arm extension/abduction/adduction, flexion or internal/external rotation.  Psychiatric: Normal mood and affect. Normal behavior. Judgment and thought content normal.   Labs, Imaging and Diagnostic Testing:  None yet.   Assessment and Plan:  Diagnoses and all orders for this visit:  Sarcoma of back (CMS-HCC) Assessment & Plan: Will plan radical excision of this mass. I discussed that a small portion of the muscles adjacent and stuck to the mass will be removed along with the mass. I got an MRI to assess the level of invasion. (No chest wall invasion, just some back musculture)  I reviewed risks including numbness, bleeding, infection, heart/lung issues, possible drain placement, possible wound breakdown.   I also got a chest CT to assess for metastatic disease. - done. No mets seen.    No follow-ups on file.  Georgianne Fick, MD

## 2021-07-28 ENCOUNTER — Encounter (HOSPITAL_COMMUNITY): Payer: Self-pay | Admitting: General Surgery

## 2021-07-30 ENCOUNTER — Emergency Department (HOSPITAL_BASED_OUTPATIENT_CLINIC_OR_DEPARTMENT_OTHER)
Admission: EM | Admit: 2021-07-30 | Discharge: 2021-07-30 | Disposition: A | Discharge status: Home / Self Care | Payer: Medicare Other | Attending: Emergency Medicine | Admitting: Emergency Medicine

## 2021-07-30 ENCOUNTER — Emergency Department (HOSPITAL_BASED_OUTPATIENT_CLINIC_OR_DEPARTMENT_OTHER): Payer: Medicare Other

## 2021-07-30 ENCOUNTER — Encounter (HOSPITAL_BASED_OUTPATIENT_CLINIC_OR_DEPARTMENT_OTHER): Payer: Self-pay | Admitting: Emergency Medicine

## 2021-07-30 DIAGNOSIS — Z79899 Other long term (current) drug therapy: Secondary | ICD-10-CM | POA: Insufficient documentation

## 2021-07-30 DIAGNOSIS — M7989 Other specified soft tissue disorders: Secondary | ICD-10-CM | POA: Diagnosis present

## 2021-07-30 DIAGNOSIS — Z7984 Long term (current) use of oral hypoglycemic drugs: Secondary | ICD-10-CM | POA: Diagnosis not present

## 2021-07-30 DIAGNOSIS — Z7982 Long term (current) use of aspirin: Secondary | ICD-10-CM | POA: Insufficient documentation

## 2021-07-30 DIAGNOSIS — I82432 Acute embolism and thrombosis of left popliteal vein: Secondary | ICD-10-CM | POA: Diagnosis not present

## 2021-07-30 DIAGNOSIS — E119 Type 2 diabetes mellitus without complications: Secondary | ICD-10-CM | POA: Insufficient documentation

## 2021-07-30 DIAGNOSIS — R600 Localized edema: Secondary | ICD-10-CM | POA: Diagnosis not present

## 2021-07-30 DIAGNOSIS — I82433 Acute embolism and thrombosis of popliteal vein, bilateral: Secondary | ICD-10-CM | POA: Diagnosis not present

## 2021-07-30 DIAGNOSIS — I10 Essential (primary) hypertension: Secondary | ICD-10-CM | POA: Diagnosis not present

## 2021-07-30 LAB — COMPREHENSIVE METABOLIC PANEL
ALT: 15 U/L (ref 0–44)
AST: 18 U/L (ref 15–41)
Albumin: 3.7 g/dL (ref 3.5–5.0)
Alkaline Phosphatase: 65 U/L (ref 38–126)
Anion gap: 6 (ref 5–15)
BUN: 15 mg/dL (ref 8–23)
CO2: 28 mmol/L (ref 22–32)
Calcium: 9 mg/dL (ref 8.9–10.3)
Chloride: 101 mmol/L (ref 98–111)
Creatinine, Ser: 1.26 mg/dL — ABNORMAL HIGH (ref 0.61–1.24)
GFR, Estimated: 59 mL/min — ABNORMAL LOW (ref >60)
Glucose, Bld: 265 mg/dL — ABNORMAL HIGH (ref 70–99)
Potassium: 4.8 mmol/L (ref 3.5–5.1)
Sodium: 135 mmol/L (ref 135–145)
Total Bilirubin: 0.5 mg/dL (ref 0.3–1.2)
Total Protein: 6.8 g/dL (ref 6.5–8.1)

## 2021-07-30 LAB — CBC
HCT: 34.2 % — ABNORMAL LOW (ref 39.0–52.0)
Hemoglobin: 11 g/dL — ABNORMAL LOW (ref 13.0–17.0)
MCH: 28.6 pg (ref 26.0–34.0)
MCHC: 32.2 g/dL (ref 30.0–36.0)
MCV: 88.8 fL (ref 80.0–100.0)
Platelets: 339 10*3/uL (ref 150–400)
RBC: 3.85 MIL/uL — ABNORMAL LOW (ref 4.22–5.81)
RDW: 12.1 % (ref 11.5–15.5)
WBC: 9 10*3/uL (ref 4.0–10.5)
nRBC: 0 % (ref 0.0–0.2)

## 2021-07-30 LAB — BRAIN NATRIURETIC PEPTIDE: B Natriuretic Peptide: 32.9 pg/mL (ref 0.0–100.0)

## 2021-07-30 MED ORDER — APIXABAN 2.5 MG PO TABS: 10.0000 mg | ORAL_TABLET | Freq: Two times a day (BID) | ORAL | Status: DC

## 2021-07-30 MED ORDER — APIXABAN (ELIQUIS) VTE STARTER PACK (10MG AND 5MG): ORAL_TABLET | ORAL | 0 refills | Status: DC

## 2021-07-30 MED ORDER — APIXABAN 2.5 MG PO TABS: 5.0000 mg | ORAL_TABLET | Freq: Two times a day (BID) | ORAL | Status: DC

## 2021-07-30 MED ORDER — APIXABAN (ELIQUIS) EDUCATION KIT FOR DVT/PE PATIENTS: PACK | Freq: Once | Status: DC

## 2021-07-30 NOTE — ED Notes (Signed)
Unable to draw labs yet as patient has been in xray and ultrasound.

## 2021-07-30 NOTE — ED Provider Notes (Signed)
[facility]ERG[facility] DEPT Provider Note   CSN: [id number] Arrival date & time: 07/30/21  8676     History Chief Complaint  Patient presents with   Leg Swelling    Efstathios Sawin is a 75 y.o. male.  HPI 75 year old m  presents today complaining of bilateral lower ale type 2 diabetes, hypertension, recent surgery for sarcomaon November 9 presents today complaining of bilateral lower extremity swelling.  He denies any chest pain, cough, or dyspnea.  He has a drain in place in his right upper back wound.  He states that he once had this checked but does not feel that he is having any problems with it.  He has had some ongoing discharge through the drain is what he was told to expect.  He has not any redness, increased pain, or swelling at that site.  Denies nausea vomiting or diarrhea.  Denies history of DVT or PE.  States that he is concerned regarding that and is why he is here today.  Both of his legs are swelling but he feels that the right is more swollen than the left.     Past Medical History:  Diagnosis Date   Arthritis    Diabetes mellitus without complication (HCC)    GERD (gastroesophageal reflux disease)    History of shingles    Hypertension     Patient Active Problem List   Diagnosis Date Noted   Hypertension    History of shingles    Diabetes mellitus without complication ([facility])    Arthritis     Past Surgical History:  Procedure Laterality Date   MASS EXCISION Right 07/27/2021   Procedure: RADICAL EXCISION OF MALIGNANT MASS RIGHT BACK;  Surgeon: Stark Klein, MD;  Location: [place];  Service: General;  Laterality: Right;       Family History  Problem Relation Age of Onset   Diabetes Mother     Social History   Tobacco Use   Smoking status: Never   Smokeless tobacco: Never  Vaping Use   Vaping Use: Never used  Substance Use Topics   Alcohol use: Not Currently    Alcohol/week: 5.0 standard drinks    Types: 5 Glasses of wine per week    Drug use: No    Home Medications Prior to Admission medications   Medication Sig Start Date End Date Taking? Authorizing Provider  aspirin 81 MG tablet Take 81 mg by mouth daily.   Yes [provider]  gabapentin (NEURONTIN) 100 MG capsule Take 1 capsule (100 mg total) by mouth 2 (two) times daily. 07/27/21 07/27/22 Yes Stark Klein, MD  glipiZIDE (GLUCOTROL) 5 MG tablet Take 5 mg by mouth every morning. 06/17/21  Yes [provider]  insulin glargine (LANTUS) 100 UNIT/ML injection Inject 15 Units into the skin daily.   Yes [provider]  levocetirizine (XYZAL) 5 MG tablet Take 5 mg by mouth every evening. 02/01/15  Yes [provider]  lisinopril (ZESTRIL) 40 MG tablet Take 40 mg by mouth daily. 05/16/21  Yes [provider]  metFORMIN (GLUCOPHAGE) 850 MG tablet Take 850 mg by mouth 2 (two) times daily with a meal.   Yes [provider]  methocarbamol (ROBAXIN) 500 MG tablet Take 1 tablet (500 mg total) by mouth every 6 (six) hours as needed for muscle spasms. 07/27/21  Yes Stark Klein, MD  Misc Natural Products [facility] COMPLEX PO) Take 1 capsule by mouth daily.   Yes [provider]  Omega-3 Fatty Acids (Joseph  OIL) 1000 MG CAPS Take 1,000 mg by mouth daily.   Yes [provider]  oxyCODONE (OXY IR/ROXICODONE) 5 MG immediate release tablet Take 1 tablet (5 mg total) by mouth every 6 (six) hours as needed for severe pain. 07/27/21  Yes Stark Klein, MD  pantoprazole (PROTONIX) 20 MG tablet Take 20 mg by mouth daily. 01/11/15  Yes [provider]  atorvastatin (LIPITOR) 40 MG tablet Take 1 tablet (40 mg total) by mouth daily. Patient not taking: No sig reported 11/21/17   Inda Coke, PA  B-D UF III MINI PEN NEEDLES 31G X 5 MM MISC USE TO CHECK BLOOD SUGAR BID 08/08/17   [provider]  Blood Glucose Monitoring Suppl (ACCU-CHEK GUIDE) w/Device KIT U UTD TO CHECK BLOOD SUGAR 09/05/17   [provider]  Blood Pressure Monitoring (BLOOD PRESSURE MONITOR AUTOMAT) DEVI TO CHECK BLOOD PRESSURE TWICE DAILY AS DIRECTED 11/21/17   Inda Coke, PA  glucose blood (ACCU-CHEK GUIDE) test strip Use to check blood sugar twice a day and prn 11/21/17   Inda Coke, PA    Allergies    Patient has no known allergies.  Review of Systems   Review of Systems  All other systems reviewed and are negative.  Physical Exam Updated Vital Signs BP (!) 196/88   Pulse 72   Temp (!) 97 F (36.1 C) (Oral)   Resp 18   SpO2 99%   Physical Exam Vitals and nursing note reviewed.  Constitutional:      Appearance: Normal appearance.  HENT:     Head: Normocephalic.     Right Ear: External ear normal.     Left Ear: External ear normal.  Eyes:     Pupils: Pupils are equal, round, and reactive to light.  Cardiovascular:     Rate and Rhythm: Normal rate and regular rhythm.  Pulmonary:     Effort: Pulmonary effort is normal.     Breath sounds: Normal breath sounds.  Abdominal:     Palpations: Abdomen is soft.  Musculoskeletal:        General: Swelling present.     Cervical back: Normal range of motion.     Right lower leg: Edema present.     Left lower leg: Edema present.  Skin:    General: Skin is warm.     Capillary Refill: Capillary refill takes less than 2 seconds.     Comments: Right upper back incision with sutures in place appears to be well-healing with no surrounding erythema, fluctuance, or warmth Drain in place  Neurological:     General: No focal deficit present.     Mental Status: He is alert.  Psychiatric:        Mood and Affect: Mood normal.    ED Results / Procedures / Treatments   Labs (all labs ordered are listed, but only abnormal results are displayed) Labs Reviewed  CBC - Abnormal; Notable for the following components:      Result Value   RBC 3.85 (*)    Hemoglobin 11.0 (*)    HCT 34.2 (*)    All other components within normal limits  COMPREHENSIVE  METABOLIC PANEL - Abnormal; Notable for the following components:   Glucose, Bld 265 (*)    Creatinine, Ser 1.26 (*)    GFR, Estimated 59 (*)    All other components within normal limits  BRAIN NATRIURETIC PEPTIDE    EKG None  Radiology US Venous Img Lower Bilateral  Result Date: 07/30/2021 CLINICAL  DATA:  Pain and swelling EXAM: Bilateral LOWER EXTREMITY VENOUS DOPPLER ULTRASOUND TECHNIQUE: Gray-scale sonography with compression, as well as color and duplex ultrasound, were performed to evaluate the deep venous system(s) from the level of the common femoral vein through the popliteal and proximal calf veins. COMPARISON:  None. FINDINGS: VENOUS There is evidence of acute DVT in the left posterior tibial vein in the mid calf. There is distention of the left posterior tibial vein at the level of the DVT. Rest of the major deep veins in both lower extremities show no signs of DVT. OTHER None. Limitations: none IMPRESSION: Acute nonocclusive DVT is seen in the left posterior tibial vein. Other major deep veins in both lower extremities show no signs of DVT. Electronically Signed   By: Elmer Picker M.D.   On: 07/30/2021 12:14   DG Chest Port 1 View  Result Date: 07/30/2021 CLINICAL DATA:  Edema in both lower extremities EXAM: PORTABLE CHEST 1 VIEW COMPARISON:  CT chest done on 07/21/2021 FINDINGS: Cardiac size is within normal limits. There are no signs of pulmonary edema. There are linear densities in both lower lung fields, more so on the left side. Left hemidiaphragm is elevated. There are surgical clips and drain overlying right lung. In the previous CT, there was a large soft tissue mass in the subcutaneous plane in the right posterior chest wall. Lateral CP angles are clear. There is no pneumothorax. IMPRESSION: There are linear densities in both lower lung fields, more so on the left side suggesting subsegmental atelectasis. There are no signs of pulmonary edema. There is no pleural  effusion or pneumothorax. Electronically Signed   By: Elmer Picker M.D.   On: 07/30/2021 11:16    Procedures Procedures   Medications Ordered in ED Medications  apixaban (ELIQUIS) tablet 10 mg (has no administration in time range)    Followed by  apixaban (ELIQUIS) tablet 5 mg (has no administration in time range)  apixaban (ELIQUIS) Education Kit for DVT/PE patients (has no administration in time range)    ED Course  I have reviewed the triage vital signs and the nursing notes.  Pertinent labs & imaging results that were available during my care of the patient were reviewed by me and considered in my medical decision making (see chart for details).    MDM Rules/Calculators/A&P                           Patient presents today with bilateral lower leg swelling.  Doppler obtained and significant for left posterior tibial DVT.  Plan anticoagulation outpatient follow-up to assess as outpatient.  We have discussed return precautions such as chest pain or dyspnea or bleeding. Patient with known hypertension did not take a.m. meds today.  He is advised to take his medications and have this rechecked as an outpatient. He has well-healing wound of his right upper back. Patient given instructions regarding need for follow-up and return and voices understanding. Final Clinical Impression(s) / ED Diagnoses Final diagnoses:  Acute deep vein thrombosis (DVT) of popliteal vein of left lower extremity [facility])    Rx / DC Orders ED Discharge Orders     None        Pattricia Boss, MD 07/30/21 1302

## 2021-07-30 NOTE — ED Notes (Signed)
Reviewed eliquis packet /education with patient. Patient voiced understanding. Dc home with belongings.

## 2021-07-30 NOTE — Discharge Instructions (Addendum)
Please take blood thinners as prescribed.  Stop aspirin if currently taking Return if you are having any worsening symptoms especially shortness of breath. Follow-up with your surgeon as planned regarding the wound on your back.  There are no signs of infection in this area today.  --------------------------------------------------------------------------------------------------------------------------------------------------------------------------- Information on my medicine - ELIQUIS (apixaban)  This medication education was reviewed with me or my healthcare representative as part of my discharge preparation.   Why was Eliquis prescribed for you? Eliquis was prescribed to treat blood clots that may have been found in the veins of your legs (deep vein thrombosis) or in your lungs (pulmonary embolism) and to reduce the risk of them occurring again.  What do You need to know about Eliquis ? The starting dose is 10 mg (two 5 mg tablets) taken TWICE daily for the FIRST SEVEN (7) DAYS, then on (enter date)  08/06/2021  the dose is reduced to ONE 5 mg tablet taken TWICE daily.  Eliquis may be taken with or without food.   Try to take the dose about the same time in the morning and in the evening. If you have difficulty swallowing the tablet whole please discuss with your pharmacist how to take the medication safely.  Take Eliquis exactly as prescribed and DO NOT stop taking Eliquis without talking to the doctor who prescribed the medication.  Stopping may increase your risk of developing a new blood clot.  Refill your prescription before you run out.  After discharge, you should have regular check-up appointments with your healthcare provider that is prescribing your Eliquis.    What do you do if you miss a dose? If a dose of ELIQUIS is not taken at the scheduled time, take it as soon as possible on the same day and twice-daily administration should be resumed. The dose should not be  doubled to make up for a missed dose.  Important Safety Information A possible side effect of Eliquis is bleeding. You should call your healthcare provider right away if you experience any of the following: Bleeding from an injury or your nose that does not stop. Unusual colored urine (red or dark brown) or unusual colored stools (red or black). Unusual bruising for unknown reasons. A serious fall or if you hit your head (even if there is no bleeding).  Some medicines may interact with Eliquis and might increase your risk of bleeding or clotting while on Eliquis. To help avoid this, consult your healthcare provider or pharmacist prior to using any new prescription or non-prescription medications, including herbals, vitamins, non-steroidal anti-inflammatory drugs (NSAIDs) and supplements.  This website has more information on Eliquis (apixaban): http://www.eliquis.com/eliquis/home

## 2021-07-30 NOTE — ED Notes (Signed)
Ultrasound at bedside

## 2021-07-30 NOTE — ED Triage Notes (Signed)
Pt had surgery onhis right lung last wedensday and still has a drain in place to right posterior back. Pt presents today with bilateral leg swelling, right more than left. Pt states he did sleep with his leg down yesterday.

## 2021-08-15 DIAGNOSIS — C499 Malignant neoplasm of connective and soft tissue, unspecified: Secondary | ICD-10-CM

## 2021-08-15 HISTORY — DX: Malignant neoplasm of connective and soft tissue, unspecified: C49.9

## 2021-08-17 DIAGNOSIS — E1165 Type 2 diabetes mellitus with hyperglycemia: Secondary | ICD-10-CM | POA: Diagnosis not present

## 2021-08-31 ENCOUNTER — Telehealth: Payer: Self-pay | Admitting: Oncology

## 2021-08-31 ENCOUNTER — Ambulatory Visit: Payer: Medicare Other | Admitting: Radiation Oncology

## 2021-08-31 ENCOUNTER — Ambulatory Visit: Payer: Medicare Other

## 2021-08-31 NOTE — Telephone Encounter (Signed)
Scheduled appt per 12/15 referral. Spoke to pt who is aware of appt date and time.

## 2021-09-01 LAB — SURGICAL PATHOLOGY

## 2021-09-05 NOTE — Progress Notes (Signed)
Histology and Location of Cancer: Sarcoma of Back  Patient was first seen by Dr. Redmond Pulling in July 2022 with a mass that had been present for around 3 months. He is not sure if it was caused by him scratching the area with his back scratcher. It caused some discomfort if he laid on it like for example when trying to sleep.   Biopsy of Soft Tissue Back 07/27/2021   Past/Anticipated interventions by Surgery, if any: Dr. Barry Dienes 08/15/2021 -Given close margins and high grade nature of the cancer, will refer for radiation oncology. Will refer to medical oncology for DVT and sarcoma given this is high chance of recurrence.  -Return in about 4 weeks (around 09/16/2021) for 4-6 weeks for wound check.  07/27/2021 -Patient underwent open incisional biopsy by Dr. 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 He is referred to me for definitive surgical tx. He did not have much post op pain. His wife does think this has gotten a bit larger.  -Will plan radical excision of this mass. I discussed that a small portion of the muscles adjacent and stuck to the mass will be removed along with the mass. I got an MRI to assess the level of invasion. (No chest wall invasion, just some back musculture)   Past/Anticipated chemotherapy by medical oncology, if any:  Dr. Alen Blew 09/13/2021   Pain on a scale of 0-10 is: Has some mild tightness in the area where his excision was.   SAFETY ISSUES: Prior radiation? No Pacemaker/ICD? No Possible current pregnancy? N/a Is the patient on methotrexate? No  Additional Complaints / other details:

## 2021-09-06 ENCOUNTER — Encounter: Payer: Self-pay | Admitting: Radiation Oncology

## 2021-09-06 ENCOUNTER — Other Ambulatory Visit: Payer: Self-pay

## 2021-09-06 ENCOUNTER — Ambulatory Visit
Admission: RE | Admit: 2021-09-06 | Discharge: 2021-09-06 | Disposition: A | Discharge status: Home / Self Care | Payer: Medicare Other | Source: Ambulatory Visit | Attending: Radiation Oncology | Admitting: Radiation Oncology

## 2021-09-06 DIAGNOSIS — Z87891 Personal history of nicotine dependence: Secondary | ICD-10-CM | POA: Diagnosis not present

## 2021-09-06 DIAGNOSIS — Z7901 Long term (current) use of anticoagulants: Secondary | ICD-10-CM | POA: Insufficient documentation

## 2021-09-06 DIAGNOSIS — Z794 Long term (current) use of insulin: Secondary | ICD-10-CM | POA: Diagnosis not present

## 2021-09-06 DIAGNOSIS — Z7984 Long term (current) use of oral hypoglycemic drugs: Secondary | ICD-10-CM | POA: Insufficient documentation

## 2021-09-06 DIAGNOSIS — Z79899 Other long term (current) drug therapy: Secondary | ICD-10-CM | POA: Insufficient documentation

## 2021-09-06 DIAGNOSIS — C493 Malignant neoplasm of connective and soft tissue of thorax: Secondary | ICD-10-CM | POA: Insufficient documentation

## 2021-09-06 DIAGNOSIS — K219 Gastro-esophageal reflux disease without esophagitis: Secondary | ICD-10-CM | POA: Insufficient documentation

## 2021-09-06 DIAGNOSIS — E119 Type 2 diabetes mellitus without complications: Secondary | ICD-10-CM | POA: Insufficient documentation

## 2021-09-06 DIAGNOSIS — I10 Essential (primary) hypertension: Secondary | ICD-10-CM | POA: Insufficient documentation

## 2021-09-06 NOTE — Progress Notes (Signed)
Radiation Oncology         [phone] ________________________________  Name: Cory Maddox        MRN: [id number]  Date of Service: 09/06/2021 DOB: 08-19-1946  YQ:MGNOIB, Cory Saxon, MD  Stark Klein, MD     REFERRING PHYSICIAN: Stark Klein, MD   DIAGNOSIS: The encounter diagnosis was Sarcoma of chest wall [facility]).   HISTORY OF PRESENT ILLNESS: Cory Maddox is a 75 y.o. male seen at the request of Dr. 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  The patient had been experiencing an increasing presence of a mass in the posterior trunk that had increased in size earlier this year.  He was seen by Dr. Redmond Pulling in July 2022. He did undergo a biopsy of this.  Initially it was felt that this was a lipoma but further dissection of this became obvious that it was not a benign lesion and the remainder of the attempted resection was aborted.  He met with Dr. Barry Dienes to discuss further surgical resection and he subsequently underwent radical excision of malignant right mass of the back on 07/27/2021.  Final pathology showed a high-grade undifferentiated pleomorphic sarcoma involving skeletal muscle with clear but close margins of the resection and overlying normal skin is closest margin was the medial superior and posterior margin 1 mm to the resection edge, the lateral 2 mm and inferior was 1.2 cm.  No lymph nodes were submitted.  He is seen to discuss adjuvant radiotherapy.    PREVIOUS RADIATION THERAPY: No   PAST MEDICAL HISTORY:  Past Medical History:  Diagnosis Date   Arthritis    Diabetes mellitus without complication (HCC)    GERD (gastroesophageal reflux disease)    History of shingles    Hypertension    Sarcoma ([place]) 08/15/2021       PAST SURGICAL HISTORY: Past Surgical History:  Procedure Laterality Date   MASS EXCISION Right 07/27/2021   Procedure: RADICAL EXCISION OF MALIGNANT MASS RIGHT BACK;  Surgeon: Stark Klein, MD;  Location: [place];  Service:  General;  Laterality: Right;     FAMILY HISTORY:  Family History  Problem Relation Age of Onset   Diabetes Mother      SOCIAL HISTORY:  reports that he has never smoked. He has never used smokeless tobacco. He reports that he does not currently use alcohol after a past usage of about 5.0 standard drinks per week. He reports that he does not use drugs.  The patient is married and lives in [place].  He enjoys golfing and is retired from the [employer] in [place].  He worked in [employer] for more than 30 years.  He still helps out periodically.   ALLERGIES: Aciphex [rabeprazole], Pravastatin, and Simvastatin   MEDICATIONS:  Current Outpatient Medications  Medication Sig Dispense Refill   APIXABAN (ELIQUIS) VTE STARTER PACK ($RemoveBefor'10MG'gLdpbyiLpRIm$  AND $Re'5MG'jwf$ ) Take as directed on package: start with two-$RemoveBefore'5mg'nBncyKMdVBozn$  tablets twice daily for 7 days. On day 8, switch to one-$RemoveBefor'5mg'IxrJIcIIBYbj$  tablet twice daily. 1 each 0   B-D UF III MINI PEN NEEDLES 31G X 5 MM MISC USE TO CHECK BLOOD SUGAR BID  3   Blood Glucose Monitoring Suppl (ACCU-CHEK GUIDE) w/Device KIT U UTD TO CHECK BLOOD SUGAR  0   Blood Pressure Monitoring (BLOOD PRESSURE MONITOR AUTOMAT) DEVI TO CHECK BLOOD PRESSURE TWICE DAILY AS DIRECTED 1 Device 0   gabapentin (NEURONTIN) 100 MG capsule Take 1 capsule (100 mg total) by mouth 2 (two) times daily. 28 capsule 1  glucose blood (ACCU-CHEK GUIDE) test strip Use to check blood sugar twice a day and prn 100 each 12   insulin glargine (LANTUS) 100 UNIT/ML injection Inject 15 Units into the skin daily.     levocetirizine (XYZAL) 5 MG tablet Take 5 mg by mouth every evening.  4   lisinopril (ZESTRIL) 40 MG tablet Take 40 mg by mouth daily.     metFORMIN (GLUCOPHAGE) 850 MG tablet Take 850 mg by mouth 2 (two) times daily with a meal.     Omega-3 Fatty Acids (FISH OIL) 1000 MG CAPS Take 1,000 mg by mouth daily.     atorvastatin (LIPITOR) 40 MG tablet Take 1 tablet (40 mg total) by mouth daily. (Patient not taking:  Reported on 07/22/2021) 90 tablet 0   glipiZIDE (GLUCOTROL) 5 MG tablet Take 5 mg by mouth every morning. (Patient not taking: Reported on 09/06/2021)     methocarbamol (ROBAXIN) 500 MG tablet Take 1 tablet (500 mg total) by mouth every 6 (six) hours as needed for muscle spasms. 20 tablet 1   Misc Natural Products (GINSENG COMPLEX PO) Take 1 capsule by mouth daily. (Patient not taking: Reported on 09/06/2021)     oxyCODONE (OXY IR/ROXICODONE) 5 MG immediate release tablet Take 1 tablet (5 mg total) by mouth every 6 (six) hours as needed for severe pain. 30 tablet 0   pantoprazole (PROTONIX) 20 MG tablet Take 20 mg by mouth daily. (Patient not taking: Reported on 09/06/2021)  2   No current facility-administered medications for this encounter.     REVIEW OF SYSTEMS: On review of systems, the patient reports that he is doing very well.  Apart from his lifting restriction he is back to his normal activities including daily exercise.  He denies any pain but does have a little bit of limited range of motion for his right upper extremity is the tightness from his incision does pull a bit however this seems to be minimally affecting his function.  No other complaints are noted    PHYSICAL EXAM:  Wt Readings from Last 3 Encounters:  09/06/21 152 lb (68.9 kg)  07/27/21 151 lb (68.5 kg)  07/26/21 151 lb 12.8 oz (68.9 kg)   Temp Readings from Last 3 Encounters:  09/06/21 97.8 F (36.6 C)  07/30/21 98.1 F (36.7 C) (Oral)  07/27/21 (!) 97.3 F (36.3 C)   BP Readings from Last 3 Encounters:  09/06/21 (!) 166/96  07/30/21 (!) 173/96  07/27/21 (!) 165/81   Pulse Readings from Last 3 Encounters:  09/06/21 88  07/30/21 74  07/27/21 70   Pain Assessment Pain Score: 0-No pain/10  In general this is a well appearing African-American male in no acute distress.  He's alert and oriented x4 and appropriate throughout the examination. Cardiopulmonary assessment is negative for acute distress and he  exhibits normal effort.  His right posterior chest wall incision is well-healed a small Steri-Strip was in place for his removed without difficulty.  A picture of his incision is seen below.  No evidence of erythema, separation or drainage is noted.  The incision line is greater than 10 cm in measurement.    ECOG = 1  0 - Asymptomatic (Fully active, able to carry on all predisease activities without restriction)  1 - Symptomatic but completely ambulatory (Restricted in physically strenuous activity but ambulatory and able to carry out work of a light or sedentary nature. For example, light housework, office work)  2 - Symptomatic, <50% in bed during the  day (Ambulatory and capable of all self care but unable to carry out any work activities. Up and about more than 50% of waking hours)  3 - Symptomatic, >50% in bed, but not bedbound (Capable of only limited self-care, confined to bed or chair 50% or more of waking hours)  4 - Bedbound (Completely disabled. Cannot carry on any self-care. Totally confined to bed or chair)  5 - Death   Eustace Pen MM, Creech RH, Tormey DC, et al. [phone]). "Toxicity and response criteria of the [facility]". [place] Oncol. 5 (6): 649-55    LABORATORY DATA:  Lab Results  Component Value Date   WBC 9.0 07/30/2021   HGB 11.0 (L) 07/30/2021   HCT 34.2 (L) 07/30/2021   MCV 88.8 07/30/2021   PLT 339 07/30/2021   Lab Results  Component Value Date   NA 135 07/30/2021   K 4.8 07/30/2021   CL 101 07/30/2021   CO2 28 07/30/2021   Lab Results  Component Value Date   ALT 15 07/30/2021   AST 18 07/30/2021   ALKPHOS 65 07/30/2021   BILITOT 0.5 07/30/2021      RADIOGRAPHY: No results found.     IMPRESSION/PLAN: 1. 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 Dr. Lisbeth Renshaw discusses the pathology findings and reviews the nature of sarcomatous disease.  Dr. Lisbeth Renshaw discusses the features of his pathology including the size of his  tumor, high-grade, and close margins would indicate a role for adjuvant treatment with localized radiation to reduce risks of local recurrence.  We discussed the risks, benefits, short, and long term effects of radiotherapy, as well as the curative intent, and the patient is interested in proceeding. Dr. Lisbeth Renshaw discusses the delivery and logistics of radiotherapy and anticipates a course of 7 weeks of radiotherapy. Written consent is obtained and placed in the chart, a copy was provided to the patient.  He will return on Thursday for simulation.  He will also meet with Dr. Alen Blew next Tuesday.   In a visit lasting 60 minutes, greater than 50% of the time was spent face to face discussing the patient's condition, in preparation for the discussion, and coordinating the patient's care.   The above documentation reflects my direct findings during this shared patient visit. Please see the separate note by Dr. Lisbeth Renshaw on this date for the remainder of the patient's plan of care.    Carola Rhine, [facility]   **Disclaimer: This note was dictated with voice recognition software. Similar sounding words can inadvertently be transcribed and this note may contain transcription errors which may not have been corrected upon publication of note.**

## 2021-09-08 ENCOUNTER — Other Ambulatory Visit: Payer: Self-pay

## 2021-09-08 ENCOUNTER — Ambulatory Visit
Admission: RE | Admit: 2021-09-08 | Discharge: 2021-09-08 | Disposition: A | Discharge status: Home / Self Care | Payer: Medicare Other | Source: Ambulatory Visit | Attending: Radiation Oncology | Admitting: Radiation Oncology

## 2021-09-08 DIAGNOSIS — C493 Malignant neoplasm of connective and soft tissue of thorax: Secondary | ICD-10-CM | POA: Diagnosis not present

## 2021-09-13 ENCOUNTER — Inpatient Hospital Stay: Payer: Medicare Other | Admitting: Oncology

## 2021-09-13 ENCOUNTER — Telehealth: Payer: Self-pay | Admitting: *Deleted

## 2021-09-13 NOTE — Telephone Encounter (Signed)
PC to patient regarding missed appointment today, he states he forgot about it.  Informed patient scheduling will contact him to reschedule this appointment - he is requesting to be seen early in the morning on a day he already has radiation.  Scheduling message sent.

## 2021-09-21 ENCOUNTER — Ambulatory Visit: Payer: Medicare Other | Admitting: Radiation Oncology

## 2021-09-22 ENCOUNTER — Ambulatory Visit: Payer: Medicare Other

## 2021-09-22 ENCOUNTER — Ambulatory Visit: Payer: Medicare Other | Admitting: Radiation Oncology

## 2021-09-23 ENCOUNTER — Ambulatory Visit: Payer: Medicare Other

## 2021-09-26 ENCOUNTER — Ambulatory Visit
Admission: RE | Admit: 2021-09-26 | Discharge: 2021-09-26 | Disposition: A | Discharge status: Home / Self Care | Payer: Medicare Other | Source: Ambulatory Visit | Attending: Radiation Oncology | Admitting: Radiation Oncology

## 2021-09-26 ENCOUNTER — Other Ambulatory Visit: Payer: Self-pay

## 2021-09-26 DIAGNOSIS — C493 Malignant neoplasm of connective and soft tissue of thorax: Secondary | ICD-10-CM | POA: Insufficient documentation

## 2021-09-27 ENCOUNTER — Ambulatory Visit
Admission: RE | Admit: 2021-09-27 | Discharge: 2021-09-27 | Disposition: A | Discharge status: Home / Self Care | Payer: Medicare Other | Source: Ambulatory Visit | Attending: Radiation Oncology | Admitting: Radiation Oncology

## 2021-09-27 ENCOUNTER — Inpatient Hospital Stay: Payer: Medicare Other | Attending: Oncology | Admitting: Oncology

## 2021-09-27 VITALS — BP 165/73 | HR 93 | Temp 98.0°F | Resp 17 | Ht 67.0 in | Wt 154.1 lb

## 2021-09-27 DIAGNOSIS — C493 Malignant neoplasm of connective and soft tissue of thorax: Secondary | ICD-10-CM

## 2021-09-27 DIAGNOSIS — R1900 Intra-abdominal and pelvic swelling, mass and lump, unspecified site: Secondary | ICD-10-CM

## 2021-09-27 DIAGNOSIS — I82432 Acute embolism and thrombosis of left popliteal vein: Secondary | ICD-10-CM | POA: Diagnosis not present

## 2021-09-27 NOTE — Progress Notes (Signed)
Reason for the request:    Sarcoma  HPI: I was asked by Dr. Barry Dienes to evaluate Cory Maddox for recent diagnosis of sarcoma.  He is a 76 year old with history of diabetes and hypertension who was found to have a mass on the trunk noted in August of 2022.  He was evaluated by Dr. Redmond Pulling and a biopsy showed a soft tissue tumor that is beyond a lipoma.  Imaging studies including MRI of the chest showed a 11.7 x 3.6 x 9.8 cm mass of the right posterior chest wall.  Based on these findings he underwent surgical resection under the care of Dr. Barry Dienes on July 27, 2021.  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  The pathological stage was T3a.  He recovered reasonably well from surgery but started developing right lower extremity discomfort and edema and was seen in the emergency department and found to have acute nonocclusive deep vein thrombosis involving the left posterior tibial vein and started on Eliquis.  He has tolerated this treatment well without any major complaints.  He was evaluated by Dr. Lisbeth Renshaw and currently receiving additional radiation treatment.  He denies any chest pain, shortness of breath or difficulty breathing.  He denies any recent hematochezia or melena.  Denies any hemoptysis or lower extremity swelling.   He does not report any headaches, blurry vision, syncope or seizures. Does not report any fevers, chills or sweats.  Does not report any cough, wheezing or hemoptysis.  Does not report any chest pain, palpitation, orthopnea or leg edema.  Does not report any nausea, vomiting or abdominal pain.  Does not report any constipation or diarrhea.  Does not report any skeletal complaints.    Does not report frequency, urgency or hematuria.  Does not report any skin rashes or lesions. Does not report any heat or cold intolerance.  Does not report any lymphadenopathy or petechiae.   Does not report any anxiety or depression.  Remaining review of systems is negative.     Past Medical History:  Diagnosis Date   Arthritis    Diabetes mellitus without complication ([place])    GERD (gastroesophageal reflux disease)    History of shingles    Hypertension    Sarcoma ([place]) 08/15/2021  :   Past Surgical History:  Procedure Laterality Date   MASS EXCISION Right 07/27/2021   Procedure: RADICAL EXCISION OF MALIGNANT MASS RIGHT BACK;  Surgeon: Stark Klein, MD;  Location: [place];  Service: General;  Laterality: Right;  :   Current Outpatient Medications:    atorvastatin (LIPITOR) 40 MG tablet, Take 1 tablet (40 mg total) by mouth daily. (Patient not taking: Reported on 07/22/2021), Disp: 90 tablet, Rfl: 0   B-D UF III MINI PEN NEEDLES 31G X 5 MM MISC, USE TO CHECK BLOOD SUGAR BID, Disp: , Rfl: 3   Blood Glucose Monitoring Suppl (ACCU-CHEK GUIDE) w/Device KIT, U UTD TO CHECK BLOOD SUGAR, Disp: , Rfl: 0   Blood Pressure Monitoring (BLOOD PRESSURE MONITOR AUTOMAT) DEVI, TO CHECK BLOOD PRESSURE TWICE DAILY AS DIRECTED, Disp: 1 Device, Rfl: 0   gabapentin (NEURONTIN) 100 MG capsule, Take 1 capsule (100 mg total) by mouth 2 (two) times daily., Disp: 28 capsule, Rfl: 1   glipiZIDE (GLUCOTROL) 5 MG tablet, Take 5 mg by mouth every morning. (Patient not taking: Reported on 09/06/2021), Disp: , Rfl:    glucose blood (ACCU-CHEK GUIDE) test strip, Use to check blood sugar  twice a day and prn, Disp: 100 each, Rfl: 12   insulin glargine (LANTUS) 100 UNIT/ML injection, Inject 15 Units into the skin daily., Disp: , Rfl:    levocetirizine (XYZAL) 5 MG tablet, Take 5 mg by mouth every evening., Disp: , Rfl: 4   lisinopril (ZESTRIL) 40 MG tablet, Take 40 mg by mouth daily., Disp: , Rfl:    metFORMIN (GLUCOPHAGE) 850 MG tablet, Take 850 mg by mouth 2 (two) times daily with a meal., Disp: , Rfl:    Misc Natural Products (GINSENG COMPLEX PO), Take 1 capsule by mouth daily. (Patient not taking:  Reported on 09/06/2021), Disp: , Rfl:    Omega-3 Fatty Acids (FISH OIL) 1000 MG CAPS, Take 1,000 mg by mouth daily., Disp: , Rfl:    pantoprazole (PROTONIX) 20 MG tablet, Take 20 mg by mouth daily. (Patient not taking: Reported on 09/06/2021), Disp: , Rfl: 2:   Allergies  Allergen Reactions   Aciphex [Rabeprazole] Other (See Comments)   Pravastatin Other (See Comments)   Simvastatin Other (See Comments)  :   Family History  Problem Relation Age of Onset   Diabetes Mother   :   Social History   Socioeconomic History   Marital status: Married    Spouse name: Not on file   Number of children: Not on file   Years of education: Not on file   Highest education level: Not on file  Occupational History   Not on file  Tobacco Use   Smoking status: Never   Smokeless tobacco: Never  Vaping Use   Vaping Use: Never used  Substance and Sexual Activity   Alcohol use: Not Currently    Alcohol/week: 5.0 standard drinks    Types: 5 Glasses of wine per week   Drug use: No   Sexual activity: Yes    Comment: married  Other Topics Concern   Not on file  Social History Narrative   Married   Retired from [employer] in 2011   Loves to [place] Strain: Not on file  Food Insecurity: Not on file  Transportation Needs: Not on file  Physical Activity: Not on file  Stress: Not on file  Social Connections: Not on file  Intimate Partner Violence: Not At Risk   Fear of Current or Ex-Partner: No   Emotionally Abused: No   Physically Abused: No   Sexually Abused: No  :  Pertinent items are noted in HPI.  Exam: Blood pressure (!) 165/73, pulse 93, temperature 98 F (36.7 C), temperature source Temporal, resp. rate 17, height _0  (1.702 m), weight 154 lb 1.6 oz (69.9 kg), SpO2 99 %. ECOG 1 General appearance: alert and cooperative appeared without distress. Head: atraumatic without any abnormalities. Eyes: conjunctivae/corneas clear. PERRL.   Sclera anicteric. Throat: lips, mucosa, and tongue normal; without oral thrush or ulcers. Resp: clear to auscultation bilaterally without rhonchi, wheezes or dullness to percussion. Cardio: regular rate and rhythm, S1, S2 normal, no murmur, click, rub or gallop GI: soft, non-tender; bowel sounds normal; no masses,  no organomegaly Skin: Skin color, texture, turgor normal. No rashes or lesions Lymph nodes: Cervical, supraclavicular, and axillary nodes normal. Neurologic: Grossly normal without any motor, sensory or deep tendon reflexes. Musculoskeletal: No joint deformity or effusion.    Assessment and Plan:    76 year old with:  1.  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  After surgical resection was found to have  high-grade T3a disease without any evidence of metastasis.  He is currently receiving adjuvant radiation therapy.  The natural course of this disease was reviewed at this time and treatment choices were reiterated.  The role for systemic therapy was discussed at this time and no adjuvant therapy is needed however systemic therapy has a role if he has advanced disease.  I have recommended strict surveillance with repeat imaging studies in April 2023 after completing radiation treatment.  Systemic therapy in the form of anthracycline based regimen among others could be used if he has advanced disease.  2.  Deep vein thrombosis: This includes left popliteal vein.  I recommended completing 3 months of left total anticoagulation.  He was thrombosis was provoked by his recent surgery and no mobility and 3 months is adequate.  3.  Follow-up: In 3 months for repeat evaluation and imaging studies.   60  minutes were dedicated to this visit. The time was spent on reviewing pathology results, imaging studies, discussing treatment options, discussing differential diagnosis and answering questions regarding future plan. Is wound    A copy of this  consult has been forwarded to the requesting physician.

## 2021-09-28 ENCOUNTER — Ambulatory Visit
Admission: RE | Admit: 2021-09-28 | Discharge: 2021-09-28 | Disposition: A | Discharge status: Home / Self Care | Payer: Medicare Other | Source: Ambulatory Visit | Attending: Radiation Oncology | Admitting: Radiation Oncology

## 2021-09-28 ENCOUNTER — Other Ambulatory Visit: Payer: Self-pay

## 2021-09-28 DIAGNOSIS — C493 Malignant neoplasm of connective and soft tissue of thorax: Secondary | ICD-10-CM | POA: Diagnosis not present

## 2021-09-29 ENCOUNTER — Ambulatory Visit
Admission: RE | Admit: 2021-09-29 | Discharge: 2021-09-29 | Disposition: A | Discharge status: Home / Self Care | Payer: Medicare Other | Source: Ambulatory Visit | Attending: Radiation Oncology | Admitting: Radiation Oncology

## 2021-09-29 DIAGNOSIS — C493 Malignant neoplasm of connective and soft tissue of thorax: Secondary | ICD-10-CM | POA: Diagnosis not present

## 2021-09-29 NOTE — Progress Notes (Signed)
Pt here for patient teaching.  Pt given Radiation and You booklet, skin care instructions, and Sonafine.  Reviewed areas of pertinence such as fatigue and skin changes . Pt able to give teach back of to pat skin and use unscented/gentle soap,apply Sonafine bid and avoid applying anything to skin within 4 hours of treatment. Pt verbalizes understanding of information given and will contact nursing with any questions or concerns.

## 2021-09-30 ENCOUNTER — Other Ambulatory Visit: Payer: Self-pay

## 2021-09-30 ENCOUNTER — Ambulatory Visit
Admission: RE | Admit: 2021-09-30 | Discharge: 2021-09-30 | Disposition: A | Discharge status: Home / Self Care | Payer: Medicare Other | Source: Ambulatory Visit | Attending: Radiation Oncology | Admitting: Radiation Oncology

## 2021-09-30 DIAGNOSIS — C493 Malignant neoplasm of connective and soft tissue of thorax: Secondary | ICD-10-CM | POA: Diagnosis not present

## 2021-09-30 MED ORDER — SONAFINE EX EMUL
1.0000 "application " | Freq: Once | CUTANEOUS | Status: AC
  Administered 2021-09-30: 1 via TOPICAL

## 2021-10-03 ENCOUNTER — Ambulatory Visit
Admission: RE | Admit: 2021-10-03 | Discharge: 2021-10-03 | Disposition: A | Discharge status: Home / Self Care | Payer: Medicare Other | Source: Ambulatory Visit | Attending: Radiation Oncology | Admitting: Radiation Oncology

## 2021-10-03 ENCOUNTER — Other Ambulatory Visit: Payer: Self-pay

## 2021-10-03 DIAGNOSIS — C493 Malignant neoplasm of connective and soft tissue of thorax: Secondary | ICD-10-CM | POA: Diagnosis not present

## 2021-10-04 ENCOUNTER — Ambulatory Visit
Admission: RE | Admit: 2021-10-04 | Discharge: 2021-10-04 | Disposition: A | Discharge status: Home / Self Care | Payer: Medicare Other | Source: Ambulatory Visit | Attending: Radiation Oncology | Admitting: Radiation Oncology

## 2021-10-04 DIAGNOSIS — C493 Malignant neoplasm of connective and soft tissue of thorax: Secondary | ICD-10-CM | POA: Diagnosis not present

## 2021-10-05 ENCOUNTER — Ambulatory Visit
Admission: RE | Admit: 2021-10-05 | Discharge: 2021-10-05 | Disposition: A | Discharge status: Home / Self Care | Payer: Medicare Other | Source: Ambulatory Visit | Attending: Radiation Oncology | Admitting: Radiation Oncology

## 2021-10-05 ENCOUNTER — Other Ambulatory Visit: Payer: Self-pay

## 2021-10-05 DIAGNOSIS — C493 Malignant neoplasm of connective and soft tissue of thorax: Secondary | ICD-10-CM | POA: Diagnosis not present

## 2021-10-06 ENCOUNTER — Ambulatory Visit
Admission: RE | Admit: 2021-10-06 | Discharge: 2021-10-06 | Disposition: A | Discharge status: Home / Self Care | Payer: Medicare Other | Source: Ambulatory Visit | Attending: Radiation Oncology | Admitting: Radiation Oncology

## 2021-10-06 DIAGNOSIS — C493 Malignant neoplasm of connective and soft tissue of thorax: Secondary | ICD-10-CM | POA: Diagnosis not present

## 2021-10-07 ENCOUNTER — Other Ambulatory Visit: Payer: Self-pay

## 2021-10-07 ENCOUNTER — Ambulatory Visit
Admission: RE | Admit: 2021-10-07 | Discharge: 2021-10-07 | Disposition: A | Discharge status: Home / Self Care | Payer: Medicare Other | Source: Ambulatory Visit | Attending: Radiation Oncology | Admitting: Radiation Oncology

## 2021-10-07 DIAGNOSIS — C493 Malignant neoplasm of connective and soft tissue of thorax: Secondary | ICD-10-CM | POA: Diagnosis not present

## 2021-10-10 ENCOUNTER — Other Ambulatory Visit: Payer: Self-pay

## 2021-10-10 ENCOUNTER — Ambulatory Visit
Admission: RE | Admit: 2021-10-10 | Discharge: 2021-10-10 | Disposition: A | Discharge status: Home / Self Care | Payer: Medicare Other | Source: Ambulatory Visit | Attending: Radiation Oncology | Admitting: Radiation Oncology

## 2021-10-10 DIAGNOSIS — C493 Malignant neoplasm of connective and soft tissue of thorax: Secondary | ICD-10-CM | POA: Diagnosis not present

## 2021-10-11 ENCOUNTER — Ambulatory Visit
Admission: RE | Admit: 2021-10-11 | Discharge: 2021-10-11 | Disposition: A | Discharge status: Home / Self Care | Payer: Medicare Other | Source: Ambulatory Visit | Attending: Radiation Oncology | Admitting: Radiation Oncology

## 2021-10-11 DIAGNOSIS — C493 Malignant neoplasm of connective and soft tissue of thorax: Secondary | ICD-10-CM | POA: Diagnosis not present

## 2021-10-12 ENCOUNTER — Other Ambulatory Visit: Payer: Self-pay

## 2021-10-12 ENCOUNTER — Ambulatory Visit
Admission: RE | Admit: 2021-10-12 | Discharge: 2021-10-12 | Disposition: A | Discharge status: Home / Self Care | Payer: Medicare Other | Source: Ambulatory Visit | Attending: Radiation Oncology | Admitting: Radiation Oncology

## 2021-10-12 DIAGNOSIS — C493 Malignant neoplasm of connective and soft tissue of thorax: Secondary | ICD-10-CM | POA: Diagnosis not present

## 2021-10-13 ENCOUNTER — Ambulatory Visit: Payer: Medicare Other

## 2021-10-13 ENCOUNTER — Ambulatory Visit
Admission: RE | Admit: 2021-10-13 | Discharge: 2021-10-13 | Disposition: A | Discharge status: Home / Self Care | Payer: Medicare Other | Source: Ambulatory Visit | Attending: Radiation Oncology | Admitting: Radiation Oncology

## 2021-10-13 DIAGNOSIS — C493 Malignant neoplasm of connective and soft tissue of thorax: Secondary | ICD-10-CM | POA: Diagnosis not present

## 2021-10-14 ENCOUNTER — Ambulatory Visit
Admission: RE | Admit: 2021-10-14 | Discharge: 2021-10-14 | Disposition: A | Discharge status: Home / Self Care | Payer: Medicare Other | Source: Ambulatory Visit | Attending: Radiation Oncology | Admitting: Radiation Oncology

## 2021-10-14 ENCOUNTER — Encounter: Payer: Self-pay | Admitting: Radiation Oncology

## 2021-10-14 DIAGNOSIS — C493 Malignant neoplasm of connective and soft tissue of thorax: Secondary | ICD-10-CM | POA: Diagnosis not present

## 2021-10-17 ENCOUNTER — Ambulatory Visit
Admission: RE | Admit: 2021-10-17 | Discharge: 2021-10-17 | Disposition: A | Discharge status: Home / Self Care | Payer: Medicare Other | Source: Ambulatory Visit | Attending: Radiation Oncology | Admitting: Radiation Oncology

## 2021-10-17 ENCOUNTER — Other Ambulatory Visit: Payer: Self-pay

## 2021-10-17 DIAGNOSIS — C493 Malignant neoplasm of connective and soft tissue of thorax: Secondary | ICD-10-CM | POA: Diagnosis not present

## 2021-10-18 ENCOUNTER — Ambulatory Visit
Admission: RE | Admit: 2021-10-18 | Discharge: 2021-10-18 | Disposition: A | Discharge status: Home / Self Care | Payer: Medicare Other | Source: Ambulatory Visit | Attending: Radiation Oncology | Admitting: Radiation Oncology

## 2021-10-18 DIAGNOSIS — C493 Malignant neoplasm of connective and soft tissue of thorax: Secondary | ICD-10-CM | POA: Diagnosis not present

## 2021-10-19 ENCOUNTER — Other Ambulatory Visit: Payer: Self-pay

## 2021-10-19 ENCOUNTER — Ambulatory Visit
Admission: RE | Admit: 2021-10-19 | Discharge: 2021-10-19 | Disposition: A | Discharge status: Home / Self Care | Payer: Medicare Other | Source: Ambulatory Visit | Attending: Radiation Oncology | Admitting: Radiation Oncology

## 2021-10-19 DIAGNOSIS — C493 Malignant neoplasm of connective and soft tissue of thorax: Secondary | ICD-10-CM | POA: Diagnosis not present

## 2021-10-20 ENCOUNTER — Ambulatory Visit
Admission: RE | Admit: 2021-10-20 | Discharge: 2021-10-20 | Disposition: A | Discharge status: Home / Self Care | Payer: Medicare Other | Source: Ambulatory Visit | Attending: Radiation Oncology | Admitting: Radiation Oncology

## 2021-10-20 DIAGNOSIS — C493 Malignant neoplasm of connective and soft tissue of thorax: Secondary | ICD-10-CM | POA: Diagnosis not present

## 2021-10-21 ENCOUNTER — Ambulatory Visit
Admission: RE | Admit: 2021-10-21 | Discharge: 2021-10-21 | Disposition: A | Discharge status: Home / Self Care | Payer: Medicare Other | Source: Ambulatory Visit | Attending: Radiation Oncology | Admitting: Radiation Oncology

## 2021-10-21 ENCOUNTER — Ambulatory Visit: Payer: Medicare Other

## 2021-10-21 ENCOUNTER — Other Ambulatory Visit: Payer: Self-pay

## 2021-10-21 DIAGNOSIS — C493 Malignant neoplasm of connective and soft tissue of thorax: Secondary | ICD-10-CM | POA: Diagnosis not present

## 2021-10-22 DIAGNOSIS — B029 Zoster without complications: Secondary | ICD-10-CM | POA: Diagnosis not present

## 2021-10-24 ENCOUNTER — Ambulatory Visit
Admission: RE | Admit: 2021-10-24 | Discharge: 2021-10-24 | Disposition: A | Discharge status: Home / Self Care | Payer: Medicare Other | Source: Ambulatory Visit | Attending: Radiation Oncology | Admitting: Radiation Oncology

## 2021-10-24 ENCOUNTER — Other Ambulatory Visit: Payer: Self-pay

## 2021-10-24 DIAGNOSIS — C493 Malignant neoplasm of connective and soft tissue of thorax: Secondary | ICD-10-CM | POA: Diagnosis not present

## 2021-10-25 ENCOUNTER — Ambulatory Visit
Admission: RE | Admit: 2021-10-25 | Discharge: 2021-10-25 | Disposition: A | Discharge status: Home / Self Care | Payer: Medicare Other | Source: Ambulatory Visit | Attending: Radiation Oncology | Admitting: Radiation Oncology

## 2021-10-25 DIAGNOSIS — C493 Malignant neoplasm of connective and soft tissue of thorax: Secondary | ICD-10-CM | POA: Diagnosis not present

## 2021-10-26 ENCOUNTER — Other Ambulatory Visit: Payer: Self-pay

## 2021-10-26 ENCOUNTER — Ambulatory Visit
Admission: RE | Admit: 2021-10-26 | Discharge: 2021-10-26 | Disposition: A | Discharge status: Home / Self Care | Payer: Medicare Other | Source: Ambulatory Visit | Attending: Radiation Oncology | Admitting: Radiation Oncology

## 2021-10-26 DIAGNOSIS — B029 Zoster without complications: Secondary | ICD-10-CM | POA: Diagnosis not present

## 2021-10-26 DIAGNOSIS — I10 Essential (primary) hypertension: Secondary | ICD-10-CM | POA: Diagnosis not present

## 2021-10-26 DIAGNOSIS — C499 Malignant neoplasm of connective and soft tissue, unspecified: Secondary | ICD-10-CM | POA: Diagnosis not present

## 2021-10-26 DIAGNOSIS — C493 Malignant neoplasm of connective and soft tissue of thorax: Secondary | ICD-10-CM | POA: Diagnosis not present

## 2021-10-26 DIAGNOSIS — E782 Mixed hyperlipidemia: Secondary | ICD-10-CM | POA: Diagnosis not present

## 2021-10-26 DIAGNOSIS — E1165 Type 2 diabetes mellitus with hyperglycemia: Secondary | ICD-10-CM | POA: Diagnosis not present

## 2021-10-27 ENCOUNTER — Ambulatory Visit
Admission: RE | Admit: 2021-10-27 | Discharge: 2021-10-27 | Disposition: A | Discharge status: Home / Self Care | Payer: Medicare Other | Source: Ambulatory Visit | Attending: Radiation Oncology | Admitting: Radiation Oncology

## 2021-10-27 DIAGNOSIS — C493 Malignant neoplasm of connective and soft tissue of thorax: Secondary | ICD-10-CM | POA: Diagnosis not present

## 2021-10-28 ENCOUNTER — Other Ambulatory Visit: Payer: Self-pay

## 2021-10-28 ENCOUNTER — Ambulatory Visit
Admission: RE | Admit: 2021-10-28 | Discharge: 2021-10-28 | Disposition: A | Discharge status: Home / Self Care | Payer: Medicare Other | Source: Ambulatory Visit | Attending: Radiation Oncology | Admitting: Radiation Oncology

## 2021-10-28 ENCOUNTER — Encounter: Payer: Self-pay | Admitting: Radiation Oncology

## 2021-10-28 DIAGNOSIS — C493 Malignant neoplasm of connective and soft tissue of thorax: Secondary | ICD-10-CM | POA: Diagnosis not present

## 2021-10-31 ENCOUNTER — Ambulatory Visit
Admission: RE | Admit: 2021-10-31 | Discharge: 2021-10-31 | Disposition: A | Discharge status: Home / Self Care | Payer: Medicare Other | Source: Ambulatory Visit | Attending: Radiation Oncology | Admitting: Radiation Oncology

## 2021-10-31 ENCOUNTER — Other Ambulatory Visit: Payer: Self-pay

## 2021-10-31 DIAGNOSIS — C493 Malignant neoplasm of connective and soft tissue of thorax: Secondary | ICD-10-CM | POA: Diagnosis not present

## 2021-10-31 NOTE — Progress Notes (Signed)
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   Intent: Palliative  Radiation Treatment Dates: 09/26/2021 through 10/28/2021 Site Technique Total Dose (Gy) Dose per Fx (Gy) Completed Fx Beam Energies  Chest Wall, Right: CW_R 3D 50/50 2 25/25 6X   Narrative: The patient tolerated radiation therapy relatively well. He developed fatigue and anticipated skin changes in the treatment field.   Plan: The patient will receive a call in about one month from the radiation oncology department. He will continue follow up with Dr. Alen Blew as well.   ________________________________________________    Carola Rhine, [facility]

## 2021-11-01 ENCOUNTER — Ambulatory Visit
Admission: RE | Admit: 2021-11-01 | Discharge: 2021-11-01 | Disposition: A | Discharge status: Home / Self Care | Payer: Medicare Other | Source: Ambulatory Visit | Attending: Radiation Oncology | Admitting: Radiation Oncology

## 2021-11-01 DIAGNOSIS — C493 Malignant neoplasm of connective and soft tissue of thorax: Secondary | ICD-10-CM | POA: Diagnosis not present

## 2021-11-02 ENCOUNTER — Ambulatory Visit
Admission: RE | Admit: 2021-11-02 | Discharge: 2021-11-02 | Disposition: A | Discharge status: Home / Self Care | Payer: Medicare Other | Source: Ambulatory Visit | Attending: Radiation Oncology | Admitting: Radiation Oncology

## 2021-11-02 ENCOUNTER — Other Ambulatory Visit: Payer: Self-pay

## 2021-11-02 DIAGNOSIS — C493 Malignant neoplasm of connective and soft tissue of thorax: Secondary | ICD-10-CM | POA: Diagnosis not present

## 2021-11-03 ENCOUNTER — Ambulatory Visit: Payer: Medicare Other

## 2021-11-03 ENCOUNTER — Ambulatory Visit
Admission: RE | Admit: 2021-11-03 | Discharge: 2021-11-03 | Disposition: A | Discharge status: Home / Self Care | Payer: Medicare Other | Source: Ambulatory Visit | Attending: Radiation Oncology | Admitting: Radiation Oncology

## 2021-11-03 DIAGNOSIS — C493 Malignant neoplasm of connective and soft tissue of thorax: Secondary | ICD-10-CM | POA: Diagnosis not present

## 2021-11-04 ENCOUNTER — Ambulatory Visit
Admission: RE | Admit: 2021-11-04 | Discharge: 2021-11-04 | Disposition: A | Discharge status: Home / Self Care | Payer: Medicare Other | Source: Ambulatory Visit | Attending: Radiation Oncology | Admitting: Radiation Oncology

## 2021-11-04 ENCOUNTER — Ambulatory Visit: Payer: Medicare Other

## 2021-11-04 ENCOUNTER — Other Ambulatory Visit: Payer: Self-pay

## 2021-11-04 ENCOUNTER — Encounter: Payer: Self-pay | Admitting: Radiation Oncology

## 2021-11-04 DIAGNOSIS — C493 Malignant neoplasm of connective and soft tissue of thorax: Secondary | ICD-10-CM | POA: Diagnosis not present

## 2021-11-07 ENCOUNTER — Ambulatory Visit: Payer: Medicare Other

## 2021-11-08 ENCOUNTER — Ambulatory Visit: Payer: Medicare Other

## 2021-11-08 NOTE — Progress Notes (Signed)
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   Intent: Palliative  Radiation Treatment Dates: 09/26/2021 through 11/04/2021 Site Technique Total Dose (Gy) Dose per Fx (Gy) Completed Fx Beam Energies  Chest Wall, Right: CW_R 3D 50/50 2 25/25 6X  Chest Wall, Right: CW_R_Bst Electron 10/10 2 5/5 6X  *updated from last documentation to show boost. Narrative: The patient tolerated radiation therapy relatively well. He developed fatigue and anticipated skin changes in the treatment field.   Plan:  The patient will receive a call in about one month from the radiation oncology department. He will continue follow up with Dr. Alen Blew as well.  .  ________________________________________________    Carola Rhine, PAC

## 2021-11-09 ENCOUNTER — Ambulatory Visit: Payer: Medicare Other

## 2021-11-10 ENCOUNTER — Ambulatory Visit: Payer: Medicare Other

## 2021-11-11 ENCOUNTER — Ambulatory Visit: Payer: Medicare Other

## 2021-11-14 ENCOUNTER — Telehealth: Payer: Self-pay | Admitting: *Deleted

## 2021-11-14 NOTE — Telephone Encounter (Signed)
Left a voicemail for the patient to let him know that he can use some neosporin with pain relief on his back to help alleviate radiation related skin irritation/breakdown.  Call back number [phone]) was left if he has further questions or concerns.  Gloriajean Dell. Leonie Green, BSN

## 2021-12-01 ENCOUNTER — Encounter: Payer: Self-pay | Admitting: *Deleted

## 2021-12-05 ENCOUNTER — Ambulatory Visit
Admission: RE | Admit: 2021-12-05 | Discharge: 2021-12-05 | Disposition: A | Discharge status: Home / Self Care | Payer: Medicare Other | Source: Ambulatory Visit | Attending: Radiation Oncology | Admitting: Radiation Oncology

## 2021-12-05 DIAGNOSIS — C496 Malignant neoplasm of connective and soft tissue of trunk, unspecified: Secondary | ICD-10-CM | POA: Diagnosis not present

## 2021-12-05 DIAGNOSIS — I82442 Acute embolism and thrombosis of left tibial vein: Secondary | ICD-10-CM | POA: Diagnosis not present

## 2021-12-05 DIAGNOSIS — C493 Malignant neoplasm of connective and soft tissue of thorax: Secondary | ICD-10-CM | POA: Insufficient documentation

## 2021-12-05 NOTE — Progress Notes (Signed)
?  Radiation Oncology         [phone] ?________________________________ ? ?Name: Cory Maddox MRN: [id number]  ?Date of Service: 12/05/2021  DOB: Jan 22, 1946 ? ?Post Treatment Telephone Note ? 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  ? ?Intent: Palliative ? ?Radiation Treatment Dates: 09/26/2021 through 11/04/2021 ?Site Technique Total Dose (Gy) Dose per Fx (Gy) Completed Fx Beam Energies  ?Chest Wall, Right: CW_R 3D 50/50 2 25/25 6X  ?Chest Wall, Right: CW_R_Bst Electron 10/10 2 5/5 6X  ?  ?Narrative: The patient tolerated radiation therapy relatively well. He developed fatigue and anticipated skin changes in the treatment field.  ? ? ? ? ?Impression/Plan: ?1. 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  I was unable to reach the patient but left a voicemail and on the message, I discussed that we would be happy to continue to follow her as needed, but she will also continue to follow up with Dr. Alen Blew in medical oncology.   ? ? ? ? ?Carola Rhine, PAC  ? ? ?  ?

## 2021-12-14 DIAGNOSIS — H2513 Age-related nuclear cataract, bilateral: Secondary | ICD-10-CM | POA: Diagnosis not present

## 2021-12-14 DIAGNOSIS — H43821 Vitreomacular adhesion, right eye: Secondary | ICD-10-CM | POA: Diagnosis not present

## 2021-12-14 DIAGNOSIS — E113291 Type 2 diabetes mellitus with mild nonproliferative diabetic retinopathy without macular edema, right eye: Secondary | ICD-10-CM | POA: Diagnosis not present

## 2021-12-14 DIAGNOSIS — E113212 Type 2 diabetes mellitus with mild nonproliferative diabetic retinopathy with macular edema, left eye: Secondary | ICD-10-CM | POA: Diagnosis not present

## 2021-12-26 ENCOUNTER — Inpatient Hospital Stay: Payer: Medicare Other | Attending: Oncology

## 2021-12-26 ENCOUNTER — Other Ambulatory Visit: Payer: Self-pay

## 2021-12-26 DIAGNOSIS — C493 Malignant neoplasm of connective and soft tissue of thorax: Secondary | ICD-10-CM | POA: Insufficient documentation

## 2021-12-26 DIAGNOSIS — R1900 Intra-abdominal and pelvic swelling, mass and lump, unspecified site: Secondary | ICD-10-CM

## 2021-12-26 LAB — CBC WITH DIFFERENTIAL (CANCER CENTER ONLY)
Abs Immature Granulocytes: 0.02 10*3/uL (ref 0.00–0.07)
Basophils Absolute: 0 10*3/uL (ref 0.0–0.1)
Basophils Relative: 1 %
Eosinophils Absolute: 0.2 10*3/uL (ref 0.0–0.5)
Eosinophils Relative: 3 %
HCT: 35 % — ABNORMAL LOW (ref 39.0–52.0)
Hemoglobin: 11.5 g/dL — ABNORMAL LOW (ref 13.0–17.0)
Immature Granulocytes: 0 %
Lymphocytes Relative: 23 %
Lymphs Abs: 1.3 10*3/uL (ref 0.7–4.0)
MCH: 28.3 pg (ref 26.0–34.0)
MCHC: 32.9 g/dL (ref 30.0–36.0)
MCV: 86.2 fL (ref 80.0–100.0)
Monocytes Absolute: 0.6 10*3/uL (ref 0.1–1.0)
Monocytes Relative: 11 %
Neutro Abs: 3.5 10*3/uL (ref 1.7–7.7)
Neutrophils Relative %: 62 %
Platelet Count: 328 10*3/uL (ref 150–400)
RBC: 4.06 MIL/uL — ABNORMAL LOW (ref 4.22–5.81)
RDW: 13.2 % (ref 11.5–15.5)
WBC Count: 5.7 10*3/uL (ref 4.0–10.5)
nRBC: 0 % (ref 0.0–0.2)

## 2021-12-26 LAB — CMP (CANCER CENTER ONLY)
ALT: 35 U/L (ref 0–44)
AST: 24 U/L (ref 15–41)
Albumin: 4 g/dL (ref 3.5–5.0)
Alkaline Phosphatase: 89 U/L (ref 38–126)
Anion gap: 5 (ref 5–15)
BUN: 23 mg/dL (ref 8–23)
CO2: 30 mmol/L (ref 22–32)
Calcium: 9.5 mg/dL (ref 8.9–10.3)
Chloride: 100 mmol/L (ref 98–111)
Creatinine: 1.23 mg/dL (ref 0.61–1.24)
GFR, Estimated: >60 mL/min (ref >60)
Glucose, Bld: 245 mg/dL — ABNORMAL HIGH (ref 70–99)
Potassium: 4.6 mmol/L (ref 3.5–5.1)
Sodium: 135 mmol/L (ref 135–145)
Total Bilirubin: 0.5 mg/dL (ref 0.3–1.2)
Total Protein: 7.5 g/dL (ref 6.5–8.1)

## 2021-12-28 ENCOUNTER — Telehealth: Payer: Self-pay | Admitting: Oncology

## 2021-12-28 NOTE — Telephone Encounter (Signed)
Called patient regarding upcoming appointment, patient has been called and voicemail was left. ?

## 2022-01-02 ENCOUNTER — Encounter (HOSPITAL_COMMUNITY): Payer: Self-pay

## 2022-01-02 ENCOUNTER — Ambulatory Visit (HOSPITAL_COMMUNITY)
Admission: RE | Admit: 2022-01-02 | Discharge: 2022-01-02 | Disposition: A | Discharge status: Home / Self Care | Payer: Medicare Other | Source: Ambulatory Visit | Attending: Oncology | Admitting: Oncology

## 2022-01-02 DIAGNOSIS — J9811 Atelectasis: Secondary | ICD-10-CM | POA: Diagnosis not present

## 2022-01-02 DIAGNOSIS — C493 Malignant neoplasm of connective and soft tissue of thorax: Secondary | ICD-10-CM | POA: Insufficient documentation

## 2022-01-02 DIAGNOSIS — K769 Liver disease, unspecified: Secondary | ICD-10-CM | POA: Diagnosis not present

## 2022-01-02 DIAGNOSIS — R918 Other nonspecific abnormal finding of lung field: Secondary | ICD-10-CM | POA: Diagnosis not present

## 2022-01-02 DIAGNOSIS — Z85831 Personal history of malignant neoplasm of soft tissue: Secondary | ICD-10-CM | POA: Diagnosis not present

## 2022-01-02 DIAGNOSIS — N281 Cyst of kidney, acquired: Secondary | ICD-10-CM | POA: Diagnosis not present

## 2022-01-02 DIAGNOSIS — R1900 Intra-abdominal and pelvic swelling, mass and lump, unspecified site: Secondary | ICD-10-CM

## 2022-01-02 MED ORDER — SODIUM CHLORIDE (PF) 0.9 % IJ SOLN
INTRAMUSCULAR | Status: AC
  Filled 2022-01-02: qty 50

## 2022-01-02 MED ORDER — IOHEXOL 300 MG/ML  SOLN
100.0000 mL | Freq: Once | INTRAMUSCULAR | Status: AC | PRN
  Administered 2022-01-02: 100 mL via INTRAVENOUS

## 2022-01-03 ENCOUNTER — Inpatient Hospital Stay (HOSPITAL_BASED_OUTPATIENT_CLINIC_OR_DEPARTMENT_OTHER): Payer: Medicare Other | Admitting: Oncology

## 2022-01-03 DIAGNOSIS — C493 Malignant neoplasm of connective and soft tissue of thorax: Secondary | ICD-10-CM

## 2022-01-03 DIAGNOSIS — R1900 Intra-abdominal and pelvic swelling, mass and lump, unspecified site: Secondary | ICD-10-CM | POA: Diagnosis not present

## 2022-01-03 DIAGNOSIS — R918 Other nonspecific abnormal finding of lung field: Secondary | ICD-10-CM | POA: Diagnosis not present

## 2022-01-03 NOTE — Progress Notes (Signed)
Hematology and Oncology Follow Up for Telemedicine Visits ? ?Reason Helzer ?[id number] ?Feb 05, 1946 76 y.o. ?01/03/2022 9:14 AM ?Shirline Frees, MDHarris, [name], MD  ? ?I connected with Mr. Fluegge on 01/03/22 at  9:30 AM EDT by telephone visit and verified that I am speaking with the correct person using two identifiers. ?  ?I discussed the limitations, risks, security and privacy concerns of performing an evaluation and management service by telemedicine and the availability of in-person appointments. I also discussed with the patient that there may be a patient responsible charge related to this service. The patient expressed understanding and agreed to proceed. ? ?Other persons participating in the visit and their role in the encounter:   ? ?Patient's location: Home ?Provider's location: Office ? ? ? 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 ? ? ?Prior Therapy:  ?He underwent surgical resection under the care of Dr. Barry Dienes on July 27, 2021.  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  The pathological stage was T3a.   ? ?He is status post adjuvant radiation therapy completed on November 04, 2021.  He received total 30 fractions to deliver 60 Pearline Cables ? ?Current therapy: Active surveillance. ? ?Interim History: Mr. Smaldone reports feeling well overall without any major complaints.  He completed radiation therapy without any major complications.  He has resumed most activities of daily living and has been very active playing golf.  He has reported recent increase in his shoulder pain that is attributed to overexertion. ? ? ? ?Medications: I have reviewed the patient's current medications.  ?Current Outpatient Medications  ?Medication Sig Dispense Refill  ? atorvastatin (LIPITOR) 40 MG tablet Take 1 tablet (40 mg total) by  mouth daily. (Patient not taking: Reported on 07/22/2021) 90 tablet 0  ? B-D UF III MINI PEN NEEDLES 31G X 5 MM MISC USE TO CHECK BLOOD SUGAR BID  3  ? Blood Glucose Monitoring Suppl (ACCU-CHEK GUIDE) w/Device KIT U UTD TO CHECK BLOOD SUGAR  0  ? Blood Pressure Monitoring (BLOOD PRESSURE MONITOR AUTOMAT) DEVI TO CHECK BLOOD PRESSURE TWICE DAILY AS DIRECTED 1 Device 0  ? gabapentin (NEURONTIN) 100 MG capsule Take 1 capsule (100 mg total) by mouth 2 (two) times daily. 28 capsule 1  ? glipiZIDE (GLUCOTROL) 5 MG tablet Take 5 mg by mouth every morning. (Patient not taking: Reported on 09/06/2021)    ? glucose blood (ACCU-CHEK GUIDE) test strip Use to check blood sugar twice a day and prn 100 each 12  ? insulin glargine (LANTUS) 100 UNIT/ML injection Inject 15 Units into the skin daily.    ? levocetirizine (XYZAL) 5 MG tablet Take 5 mg by mouth every evening.  4  ? lisinopril (ZESTRIL) 40 MG tablet Take 40 mg by mouth daily.    ? metFORMIN (GLUCOPHAGE) 850 MG tablet Take 850 mg by mouth 2 (two) times daily with a meal.    ? Misc Natural Products (GINSENG COMPLEX PO) Take 1 capsule by mouth daily. (Patient not taking: Reported on 09/06/2021)    ? Omega-3 Fatty Acids (FISH OIL) 1000 MG CAPS Take 1,000 mg by mouth daily.    ? pantoprazole (PROTONIX) 20 MG tablet Take 20 mg by mouth daily. (Patient not taking: Reported on 09/06/2021)  2  ? ?No current facility-administered medications for this visit.  ? ? ? ?Allergies:  ?Allergies  ?Allergen Reactions  ? Aciphex [Rabeprazole] Other (See  Comments)  ? Lisinopril   ?  Other reaction(s): lightheaded  ? Pravastatin Other (See Comments)  ? Simvastatin Other (See Comments)  ? ? ? ? ? ?Lab Results: ?Lab Results  ?Component Value Date  ? WBC 5.7 12/26/2021  ? HGB 11.5 (L) 12/26/2021  ? HCT 35.0 (L) 12/26/2021  ? MCV 86.2 12/26/2021  ? PLT 328 12/26/2021  ? ?  Chemistry   ?   ?Component Value Date/Time  ? NA 135 12/26/2021 0948  ? K 4.6 12/26/2021 0948  ? CL 100 12/26/2021 0948  ? CO2  30 12/26/2021 0948  ? BUN 23 12/26/2021 0948  ? CREATININE 1.23 12/26/2021 0948  ?    ?Component Value Date/Time  ? CALCIUM 9.5 12/26/2021 0948  ? ALKPHOS 89 12/26/2021 0948  ? AST 24 12/26/2021 0948  ? ALT 35 12/26/2021 0948  ? BILITOT 0.5 12/26/2021 0948  ?  ? ? ? ?IMPRESSION: ?1. Postsurgical change in the posterior right chest wall mass ?resection, with a thin walled fluid collection in the surgical bed ?measuring favored to reflect a seroma, without suspicious enhancing ?soft tissue to suggest local recurrence. ?2. Scattered small right lower lobe pulmonary nodules measure up to ?3 mm, nonspecific and favored to reflect an infectious or ?inflammatory process. However, metastatic disease not technically ?excluded. Consider attention on short-term follow-up chest CT. ?3. No evidence of metastatic disease in the abdomen or pelvis. No ?thoracic adenopathy. ?4. Moderate volume of formed stool throughout the colon. Correlate ?for constipation. ?  ? ? ?Impression and Plan: ? ?76 year old with: ? ?1.  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  He is status post surgical resection and followed by adjuvant radiation therapy. ?  ?His disease status was updated and treatment choices were reviewed.  Salvage treatment options including systemic chemotherapy utilizing anthracycline versus continued active surveillance.  CT scan obtained on January 02, 2022 was personally reviewed and discussed with the patient today.  The results do not indicate any evidence of relapsed disease at this time.  I recommended continued active surveillance.  We will repeat imaging studies in the next 5 months. ?  ? ?  ?2.  Follow-up: He will return in 5 months for repeat follow-up. ?  ?  ?  ? ? ?I discussed the assessment and treatment plan with the patient. The patient was provided an opportunity to ask questions and all were answered. The patient agreed with the plan and demonstrated an understanding  of the instructions. ?  ?The patient was advised to call back or seek an in-person evaluation if the symptoms worsen or if the condition fails to improve as anticipated. ? ?I provided 20 minutes of non face-to-face telephone visit time during this encounter.  The time was dedicated to reviewing imaging studies, disease status update and treatment choices for the future. ? ?Zola Button, MD 01/03/2022 9:14 AM ? ?
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
## 2022-01-21 ENCOUNTER — Emergency Department (HOSPITAL_COMMUNITY)
Admission: EM | Admit: 2022-01-21 | Discharge: 2022-01-21 | Discharge status: Against Medical Advice | Payer: Medicare Other

## 2022-01-21 ENCOUNTER — Emergency Department (HOSPITAL_BASED_OUTPATIENT_CLINIC_OR_DEPARTMENT_OTHER): Payer: Medicare Other | Admitting: Radiology

## 2022-01-21 ENCOUNTER — Emergency Department (HOSPITAL_BASED_OUTPATIENT_CLINIC_OR_DEPARTMENT_OTHER)
Admission: EM | Admit: 2022-01-21 | Discharge: 2022-01-21 | Disposition: A | Discharge status: Home / Self Care | Payer: Medicare Other | Attending: Emergency Medicine | Admitting: Emergency Medicine

## 2022-01-21 ENCOUNTER — Other Ambulatory Visit: Payer: Self-pay

## 2022-01-21 ENCOUNTER — Emergency Department (HOSPITAL_BASED_OUTPATIENT_CLINIC_OR_DEPARTMENT_OTHER): Payer: Medicare Other

## 2022-01-21 DIAGNOSIS — J181 Lobar pneumonia, unspecified organism: Secondary | ICD-10-CM | POA: Insufficient documentation

## 2022-01-21 DIAGNOSIS — R252 Cramp and spasm: Secondary | ICD-10-CM | POA: Diagnosis not present

## 2022-01-21 DIAGNOSIS — K760 Fatty (change of) liver, not elsewhere classified: Secondary | ICD-10-CM | POA: Diagnosis not present

## 2022-01-21 DIAGNOSIS — R1011 Right upper quadrant pain: Secondary | ICD-10-CM

## 2022-01-21 DIAGNOSIS — R109 Unspecified abdominal pain: Secondary | ICD-10-CM | POA: Diagnosis present

## 2022-01-21 DIAGNOSIS — E119 Type 2 diabetes mellitus without complications: Secondary | ICD-10-CM | POA: Insufficient documentation

## 2022-01-21 DIAGNOSIS — I10 Essential (primary) hypertension: Secondary | ICD-10-CM | POA: Diagnosis not present

## 2022-01-21 DIAGNOSIS — K381 Appendicular concretions: Secondary | ICD-10-CM | POA: Diagnosis not present

## 2022-01-21 DIAGNOSIS — J189 Pneumonia, unspecified organism: Secondary | ICD-10-CM

## 2022-01-21 DIAGNOSIS — R059 Cough, unspecified: Secondary | ICD-10-CM | POA: Diagnosis not present

## 2022-01-21 LAB — COMPREHENSIVE METABOLIC PANEL
ALT: 19 U/L (ref 0–44)
AST: 18 U/L (ref 15–41)
Albumin: 4 g/dL (ref 3.5–5.0)
Alkaline Phosphatase: 81 U/L (ref 38–126)
Anion gap: 8 (ref 5–15)
BUN: 21 mg/dL (ref 8–23)
CO2: 26 mmol/L (ref 22–32)
Calcium: 9 mg/dL (ref 8.9–10.3)
Chloride: 101 mmol/L (ref 98–111)
Creatinine, Ser: 1.27 mg/dL — ABNORMAL HIGH (ref 0.61–1.24)
GFR, Estimated: 59 mL/min — ABNORMAL LOW (ref >60)
Glucose, Bld: 188 mg/dL — ABNORMAL HIGH (ref 70–99)
Potassium: 4.7 mmol/L (ref 3.5–5.1)
Sodium: 135 mmol/L (ref 135–145)
Total Bilirubin: 0.6 mg/dL (ref 0.3–1.2)
Total Protein: 7.5 g/dL (ref 6.5–8.1)

## 2022-01-21 LAB — URINALYSIS, ROUTINE W REFLEX MICROSCOPIC
Bilirubin Urine: NEGATIVE
Glucose, UA: NEGATIVE mg/dL
Hgb urine dipstick: NEGATIVE
Ketones, ur: NEGATIVE mg/dL
Leukocytes,Ua: NEGATIVE
Nitrite: NEGATIVE
Specific Gravity, Urine: 1.009 (ref 1.005–1.030)
pH: 5.5 (ref 5.0–8.0)

## 2022-01-21 LAB — CBC WITH DIFFERENTIAL/PLATELET
Abs Immature Granulocytes: 0.06 10*3/uL (ref 0.00–0.07)
Basophils Absolute: 0 10*3/uL (ref 0.0–0.1)
Basophils Relative: 0 %
Eosinophils Absolute: 0.1 10*3/uL (ref 0.0–0.5)
Eosinophils Relative: 3 %
HCT: 35.7 % — ABNORMAL LOW (ref 39.0–52.0)
Hemoglobin: 11.7 g/dL — ABNORMAL LOW (ref 13.0–17.0)
Immature Granulocytes: 1 %
Lymphocytes Relative: 19 %
Lymphs Abs: 0.9 10*3/uL (ref 0.7–4.0)
MCH: 28.2 pg (ref 26.0–34.0)
MCHC: 32.8 g/dL (ref 30.0–36.0)
MCV: 86 fL (ref 80.0–100.0)
Monocytes Absolute: 0.7 10*3/uL (ref 0.1–1.0)
Monocytes Relative: 14 %
Neutro Abs: 3 10*3/uL (ref 1.7–7.7)
Neutrophils Relative %: 63 %
Platelets: 375 10*3/uL (ref 150–400)
RBC: 4.15 MIL/uL — ABNORMAL LOW (ref 4.22–5.81)
RDW: 12.4 % (ref 11.5–15.5)
WBC: 4.8 10*3/uL (ref 4.0–10.5)
nRBC: 0 % (ref 0.0–0.2)

## 2022-01-21 LAB — MAGNESIUM: Magnesium: 2 mg/dL (ref 1.7–2.4)

## 2022-01-21 LAB — TROPONIN I (HIGH SENSITIVITY): Troponin I (High Sensitivity): 5 ng/L (ref <18)

## 2022-01-21 LAB — CK: Total CK: 181 U/L (ref 49–397)

## 2022-01-21 LAB — LIPASE, BLOOD: Lipase: 14 U/L (ref 11–51)

## 2022-01-21 MED ORDER — SODIUM CHLORIDE 0.9 % IV BOLUS
500.0000 mL | Freq: Once | INTRAVENOUS | Status: AC
  Administered 2022-01-21: 500 mL via INTRAVENOUS

## 2022-01-21 MED ORDER — IOHEXOL 300 MG/ML  SOLN
100.0000 mL | Freq: Once | INTRAMUSCULAR | Status: AC | PRN
  Administered 2022-01-21: 100 mL via INTRAVENOUS

## 2022-01-21 MED ORDER — DOXYCYCLINE HYCLATE 100 MG PO CAPS: 100.0000 mg | ORAL_CAPSULE | Freq: Two times a day (BID) | ORAL | 0 refills | Status: AC

## 2022-01-21 NOTE — ED Provider Notes (Signed)
? ?Emergency Department Provider Note ? ? ?I have reviewed the triage vital signs and the nursing notes. ? ? ?HISTORY ? ?Chief Complaint ?Abdominal Pain ? ? ?HPI ?Cory Maddox is a 76 y.o. male presents to the ED with burning abdominal pain. Symptoms developing over the last week. Pain radiates to the lower back. No nausea, vomiting, or diarrhea. No CP. Patient is having regular BMs. No UTI symptoms. No fever/chills. No injury. Patient also reports some cramping in the arms/legs with muscle soreness.  ? ? ?Past Medical History:  ?Diagnosis Date  ? Arthritis   ? Diabetes mellitus without complication ([place])   ? GERD (gastroesophageal reflux disease)   ? History of shingles   ? Hypertension   ? Sarcoma ([place]) 08/15/2021  ? ? ?Review of Systems ? ?Constitutional: No fever/chills ?Eyes: No visual changes. ?ENT: No sore throat. ?Cardiovascular: Denies chest pain. ?Respiratory: Denies shortness of breath. ?Gastrointestinal: Positive abdominal pain.  No nausea, no vomiting.  No diarrhea.  No constipation. ?Genitourinary: Negative for dysuria. ?Musculoskeletal: Negative for back pain. ?Skin: Negative for rash. ?Neurological: Negative for headaches, focal weakness or numbness. ? ?____________________________________________ ? ? ?PHYSICAL EXAM: ? ?VITAL SIGNS: ?ED Triage Vitals  ?Enc Vitals Group  ?   BP 01/21/22 1012 (!) 150/89  ?   Pulse Rate 01/21/22 1012 76  ?   Resp 01/21/22 1012 16  ?   Temp 01/21/22 1012 98.5 ?F (36.9 ?C)  ?   Temp Source 01/21/22 1012 Oral  ?   SpO2 01/21/22 1012 100 %  ?   Weight 01/21/22 1011 152 lb (68.9 kg)  ?   Height 01/21/22 1011 '5\' 7"'$  (1.702 m)  ? ?Constitutional: Alert and oriented. Well appearing and in no acute distress. ?Eyes: Conjunctivae are normal. ?Head: Atraumatic. ?Nose: No congestion/rhinnorhea. ?Mouth/Throat: Mucous membranes are moist.   ?Neck: No stridor.   ?Cardiovascular: Normal rate, regular rhythm. Good peripheral circulation. Grossly normal heart sounds.   ?Respiratory:  Normal respiratory effort.  No retractions. Lungs CTAB. ?Gastrointestinal: Soft and nontender. No distention.  ?Musculoskeletal: No lower extremity tenderness nor edema. No gross deformities of extremities. ?Neurologic:  Normal speech and language. No gross focal neurologic deficits are appreciated.  ?Skin:  Skin is warm, dry and intact. No rash noted. ? ?____________________________________________ ?  ?LABS ?(all labs ordered are listed, but only abnormal results are displayed) ? ?Labs Reviewed  ?COMPREHENSIVE METABOLIC PANEL - Abnormal; Notable for the following components:  ?    Result Value  ? Glucose, Bld 188 (*)   ? Creatinine, Ser 1.27 (*)   ? GFR, Estimated 59 (*)   ? All other components within normal limits  ?CBC WITH DIFFERENTIAL/PLATELET - Abnormal; Notable for the following components:  ? RBC 4.15 (*)   ? Hemoglobin 11.7 (*)   ? HCT 35.7 (*)   ? All other components within normal limits  ?URINALYSIS, ROUTINE W REFLEX MICROSCOPIC - Abnormal; Notable for the following components:  ? Protein, ur TRACE (*)   ? All other components within normal limits  ?LIPASE, BLOOD  ?CK  ?MAGNESIUM  ?TROPONIN I (HIGH SENSITIVITY)  ? ?____________________________________________ ? ?EKG ? ? EKG Interpretation ? ?Date/Time:  Saturday Jan 21 2022 10:46:39 EDT ?Ventricular Rate:  68 ?PR Interval:  130 ?QRS Duration: 85 ?QT Interval:  383 ?QTC Calculation: 408 ?R Axis:   35 ?Text Interpretation: Sinus rhythm No old tracing to compare Confirmed by Davonna Belling [phone]) on 01/22/2022 5:18:53 PM ?  ? ?  ? ?____________________________________________ ? ? ?PROCEDURES ? ?  Procedure(s) performed:  ? ?Procedures ? ?None ?____________________________________________ ? ? ?INITIAL IMPRESSION / ASSESSMENT AND PLAN / ED COURSE ? ?Pertinent labs & imaging results that were available during my care of the patient were reviewed by me and considered in my medical decision making (see chart for details). ?  ?This patient is Presenting for  Evaluation of abdominal pain, which does require a range of treatment options, and is a complaint that involves a high risk of morbidity and mortality. ? ?The Differential Diagnoses includes but is not exclusive to acute cholecystitis, intrathoracic causes for epigastric abdominal pain, gastritis, duodenitis, pancreatitis, small bowel or large bowel obstruction, abdominal aortic aneurysm, hernia, gastritis, etc. ? ? ?Critical Interventions-  ?  ?Medications  ?sodium chloride 0.9 % bolus 500 mL (0 mLs Intravenous Stopped 01/21/22 1220)  ?iohexol (OMNIPAQUE) 300 MG/ML solution 100 mL (100 mLs Intravenous Contrast Given 01/21/22 1154)  ? ? ?Reassessment after intervention: Pain improved.  ? ?  ?Clinical Laboratory Tests Ordered, included CBC without leukocytosis. No AKI or electrolyte disturbance. Normal LFTs. CK normal.  ? ?Radiologic Tests Ordered, included CT abdomen/pelvis and CXR. I independently interpreted the images and agree with radiology interpretation.  ? ?Cardiac Monitor Tracing which shows NSR. ? ? ?Social Determinants of Health Risk patient is a non-smoker.  ? ?Medical Decision Making: Summary:  ?Patient with dermatomal regional pain in the epigastric/chest pain. CT imaging reassuring. CXR with streaky finding noted. Will cover with abx.  Considered developing zoster but no rash. Labs are reassuring. Plan for continued supportive care and PCP follow up.  ? ?Reevaluation with update and discussion with patient. Reviewed. Discussed PCP follow up and ED return precautions.  ? ?Disposition: discharge ? ?____________________________________________ ? ?FINAL CLINICAL IMPRESSION(S) / ED DIAGNOSES ? ?Final diagnoses:  ?Right upper quadrant abdominal pain  ?Community acquired pneumonia of right lower lobe of lung  ? ? ? ?NEW OUTPATIENT MEDICATIONS STARTED DURING THIS VISIT: ? ?Discharge Medication List as of 01/21/2022 12:44 PM  ?  ? ?START taking these medications  ? Details  ?doxycycline (VIBRAMYCIN) 100 MG capsule  Take 1 capsule (100 mg total) by mouth 2 (two) times daily for 7 days., Starting Sat 01/21/2022, Until Sat 01/28/2022, Normal  ?  ?  ? ? ?Note:  This document was prepared using Dragon voice recognition software and may include unintentional dictation errors. ? ?Nanda Quinton, MD, FACEP ?Emergency Medicine ? ?  ?Margette Fast, MD ?01/24/22 [phone] ? ?

## 2022-01-21 NOTE — ED Notes (Signed)
Pt upset when he asked if he would have to return and wait in lobby. Pt elected to leave. ?

## 2022-01-21 NOTE — ED Notes (Signed)
Discharge instructions, follow up care, and prescriptions reviewed and explained, pt verbalized understanding.  

## 2022-01-21 NOTE — ED Triage Notes (Signed)
Pt from home c/o right constant burning abdominal pain radiating to right lower back x 1 week, worst last night. Denies n/v/d/constipation. Last normal BM yesterday morning. Denies dysuria, no urine changes.  ?

## 2022-01-21 NOTE — Discharge Instructions (Signed)
You are seen in the emergency room today with right abdominal discomfort and cough.  Your x-ray shows a possible developing pneumonia and I am starting on antibiotics.  Your CT scan did not show anything to explain your pain and I suspect your discomfort is related to your developing pneumonia.  Please follow-up with your primary care doctor for repeat chest x-ray in the next couple of weeks after he finished antibiotics.  Return with any new or suddenly worsening symptoms. ?

## 2022-02-01 ENCOUNTER — Encounter (HOSPITAL_BASED_OUTPATIENT_CLINIC_OR_DEPARTMENT_OTHER): Payer: Self-pay

## 2022-02-01 ENCOUNTER — Emergency Department (HOSPITAL_BASED_OUTPATIENT_CLINIC_OR_DEPARTMENT_OTHER): Payer: Medicare Other | Admitting: Radiology

## 2022-02-01 ENCOUNTER — Other Ambulatory Visit: Payer: Self-pay

## 2022-02-01 ENCOUNTER — Emergency Department (HOSPITAL_BASED_OUTPATIENT_CLINIC_OR_DEPARTMENT_OTHER)
Admission: EM | Admit: 2022-02-01 | Discharge: 2022-02-01 | Disposition: A | Discharge status: Home / Self Care | Payer: Medicare Other | Attending: Emergency Medicine | Admitting: Emergency Medicine

## 2022-02-01 DIAGNOSIS — R0789 Other chest pain: Secondary | ICD-10-CM | POA: Diagnosis not present

## 2022-02-01 DIAGNOSIS — J181 Lobar pneumonia, unspecified organism: Secondary | ICD-10-CM | POA: Diagnosis not present

## 2022-02-01 DIAGNOSIS — J168 Pneumonia due to other specified infectious organisms: Secondary | ICD-10-CM | POA: Diagnosis not present

## 2022-02-01 DIAGNOSIS — R1011 Right upper quadrant pain: Secondary | ICD-10-CM | POA: Insufficient documentation

## 2022-02-01 DIAGNOSIS — J189 Pneumonia, unspecified organism: Secondary | ICD-10-CM

## 2022-02-01 NOTE — ED Provider Notes (Signed)
?[facility] EMERGENC[facility] DEPT ?Provider Note ? ? ?CSN: [id number] ?Arrival date & time: 02/01/22  1109 ? ?  ? ?History ? ?Chief Complaint  ?Patient presents with  ? Abdominal Pain  ? ? ?Cory Maddox is a 76 y.o. male. ? ?Patient presents with intermittent right upper abdominal pain and bloating and persistent cough since earlier this month.  Patient was seen on May 6 and had CT and blood work done.  Patient was on antibiotic for concern for pneumonia however lost it and missed 3 days of medications.  Patient denies any chest pain or shortness of breath but does still have persistent cough.  No fevers or chills.  No pain after eating fatty foods.  No known gallbladder problems.  No urinary symptoms. ? ? ?  ? ?Home Medications ?Prior to Admission medications   ?Medication Sig Start Date End Date Taking? Authorizing Provider  ?atorvastatin (LIPITOR) 40 MG tablet Take 1 tablet (40 mg total) by mouth daily. ?Patient not taking: Reported on 07/22/2021 11/21/17   Inda Coke, PA  ?B-D UF III MINI PEN NEEDLES 31G X 5 MM MISC USE TO CHECK BLOOD SUGAR BID 08/08/17   [provider]  ?Blood Glucose Monitoring Suppl (ACCU-CHEK GUIDE) w/Device KIT U UTD TO CHECK BLOOD SUGAR 09/05/17   [provider]  ?Blood Pressure Monitoring (BLOOD PRESSURE MONITOR AUTOMAT) DEVI TO CHECK BLOOD PRESSURE TWICE DAILY AS DIRECTED 11/21/17   Inda Coke, PA  ?gabapentin (NEURONTIN) 100 MG capsule Take 1 capsule (100 mg total) by mouth 2 (two) times daily. 07/27/21 07/27/22  Stark Klein, MD  ?glipiZIDE (GLUCOTROL) 5 MG tablet Take 5 mg by mouth every morning. ?Patient not taking: Reported on 09/06/2021 06/17/21   [provider]  ?glucose blood (ACCU-CHEK GUIDE) test strip Use to check blood sugar twice a day and prn 11/21/17   Inda Coke, PA  ?insulin glargine (LANTUS) 100 UNIT/ML injection Inject 15 Units into the skin daily.    [provider]  ?levocetirizine (XYZAL) 5 MG tablet Take 5 mg  by mouth every evening. 02/01/15   [provider]  ?lisinopril (ZESTRIL) 40 MG tablet Take 40 mg by mouth daily. 05/16/21   [provider]  ?metFORMIN (GLUCOPHAGE) 850 MG tablet Take 850 mg by mouth 2 (two) times daily with a meal.    [provider]  ?Misc Natural Products (GINSENG COMPLEX PO) Take 1 capsule by mouth daily. ?Patient not taking: Reported on 09/06/2021    [provider]  ?Omega-3 Fatty Acids (FISH OIL) 1000 MG CAPS Take 1,000 mg by mouth daily.    [provider]  ?pantoprazole (PROTONIX) 20 MG tablet Take 20 mg by mouth daily. ?Patient not taking: Reported on 09/06/2021 01/11/15   [provider]  ?   ? ?Allergies    ?Aciphex [rabeprazole], Lisinopril, Pravastatin, and Simvastatin   ? ?Review of Systems   ?Review of Systems  ?Constitutional:  Negative for chills and fever.  ?HENT:  Positive for congestion.   ?Eyes:  Negative for visual disturbance.  ?Respiratory:  Positive for cough. Negative for shortness of breath.   ?Cardiovascular:  Negative for chest pain.  ?Gastrointestinal:  Positive for abdominal pain. Negative for vomiting.  ?Genitourinary:  Negative for dysuria and flank pain.  ?Musculoskeletal:  Negative for back pain, neck pain and neck stiffness.  ?Skin:  Negative for rash.  ?Neurological:  Negative for light-headedness and headaches.  ? ?Physical Exam ?Updated Vital Signs ?BP 130/78 (BP Location: Right Arm)   Pulse 66  Temp 98.3 ?F (36.8 ?C)   Resp 20   Ht _0  (1.702 m)   Wt 68.9 kg   SpO2 100%   BMI 23.81 kg/m?  ?Physical Exam ?Vitals and nursing note reviewed.  ?Constitutional:   ?   General: He is not in acute distress. ?   Appearance: He is well-developed.  ?HENT:  ?   Head: Normocephalic and atraumatic.  ?   Mouth/Throat:  ?   Mouth: Mucous membranes are moist.  ?Eyes:  ?   General:     ?   Right eye: No discharge.     ?   Left eye: No discharge.  ?   Conjunctiva/sclera: Conjunctivae normal.  ?Neck:  ?   Trachea: No  tracheal deviation.  ?Cardiovascular:  ?   Rate and Rhythm: Normal rate and regular rhythm.  ?   Heart sounds: No murmur heard. ?Pulmonary:  ?   Effort: Pulmonary effort is normal.  ?   Breath sounds: Normal breath sounds.  ?Abdominal:  ?   General: There is no distension.  ?   Palpations: Abdomen is soft.  ?   Tenderness: There is no abdominal tenderness. There is no guarding.  ?Musculoskeletal:  ?   Cervical back: Normal range of motion and neck supple. No rigidity.  ?Skin: ?   General: Skin is warm.  ?   Capillary Refill: Capillary refill takes less than 2 seconds.  ?   Findings: No rash.  ?Neurological:  ?   General: No focal deficit present.  ?   Mental Status: He is alert.  ?   Cranial Nerves: No cranial nerve deficit.  ?Psychiatric:     ?   Mood and Affect: Mood normal.  ? ? ?ED Results / Procedures / Treatments   ?Labs ?(all labs ordered are listed, but only abnormal results are displayed) ?Labs Reviewed - No data to display ? ?EKG ?EKG Interpretation ? ?Date/Time:  Wednesday Feb 01 2022 11:45:58 EDT ?Ventricular Rate:  73 ?PR Interval:  136 ?QRS Duration: 86 ?QT Interval:  352 ?QTC Calculation: 387 ?R Axis:   50 ?Text Interpretation: Normal sinus rhythm Normal ECG When compared with ECG of 21-Jan-2022 10:46, PREVIOUS ECG IS PRESENT Confirmed by Elnora Morrison [phone]) on 02/01/2022 1:40:31 PM ? ?Radiology ?DG Chest 2 View ? ?Result Date: 02/01/2022 ?CLINICAL DATA:  Right lower chest wall pain. History of chest wall sarcoma. EXAM: CHEST - 2 VIEW COMPARISON:  Chest x-ray 01/21/2022.  Chest CT 01/02/2022 FINDINGS: Cardiac and mediastinal contours normal. Vascularity normal. Left lung clear. Mild patchy airspace density in the right lung with mild progression. No pleural effusion on the right. Surgical clips in the posterior soft tissues of the right chest wall IMPRESSION: Mild airspace disease in the right has progressed. Probable pneumonia. Continued follow-up recommended. Electronically Signed   By: Franchot Gallo M.D.   On: 02/01/2022 12:32   ? ?Procedures ?Procedures  ? ? ?Medications Ordered in ED ?Medications - No data to display ? ?ED Course/ Medical Decision Making/ A&P ?  ?                        ?Medical Decision Making ?Amount and/or Complexity of Data Reviewed ?Radiology: ordered. ? ? ?Patient presents with recurrent right upper abdominal discomfort and cough with clinical concern for lower lobe pneumonia based on persistent cough and losing medications however other differentials include biliary, urinary related, bowel related, rib, malignancy, other.  Reviewed medical records and CT  scan performed on May 6 with no acute abnormalities.  Patient well-appearing ER normal oxygenation normal vital signs.  Patient has his prescription and is able to complete the antibiotics and follow-up with primary doctor on Monday. ? ? ? ? ? ? ? ?Final Clinical Impression(s) / ED Diagnoses ?Final diagnoses:  ?Right upper quadrant abdominal pain  ?Pneumonia of right lower lobe due to infectious organism  ? ? ?Rx / DC Orders ?ED Discharge Orders   ? ? None  ? ?  ? ? ?  ?Elnora Morrison, MD ?02/01/22 1428 ? ?

## 2022-02-01 NOTE — ED Triage Notes (Signed)
He reports having been seen here this month for upper right abd. Area pain. His only dx at that time was of possible right-sided infiltrate, all intestinal studies being negative. He c/o persistent pain at ruq area radiating toward chest. Phe further tells me that his "grandaughter misplaced my antibiotic", therefore, he has not taken all prescribed doses of same. He is ambulatory and in no distress. ?

## 2022-02-01 NOTE — ED Notes (Signed)
Patient verbalizes understanding of discharge instructions. Opportunity for questioning and answers were provided. Patient discharged from ED.  °

## 2022-02-01 NOTE — Discharge Instructions (Signed)
Take your antibiotics that have been prescribed. ?Return for difficulty breathing or new concerns. ?

## 2022-02-10 DIAGNOSIS — Z85831 Personal history of malignant neoplasm of soft tissue: Secondary | ICD-10-CM | POA: Diagnosis not present

## 2022-02-10 DIAGNOSIS — J189 Pneumonia, unspecified organism: Secondary | ICD-10-CM | POA: Diagnosis not present

## 2022-02-10 DIAGNOSIS — I10 Essential (primary) hypertension: Secondary | ICD-10-CM | POA: Diagnosis not present

## 2022-02-10 DIAGNOSIS — E1165 Type 2 diabetes mellitus with hyperglycemia: Secondary | ICD-10-CM | POA: Diagnosis not present

## 2022-02-24 ENCOUNTER — Other Ambulatory Visit: Payer: Self-pay | Admitting: Family Medicine

## 2022-02-24 ENCOUNTER — Ambulatory Visit
Admission: RE | Admit: 2022-02-24 | Discharge: 2022-02-24 | Disposition: A | Discharge status: Home / Self Care | Payer: Medicare Other | Source: Ambulatory Visit | Attending: Family Medicine | Admitting: Family Medicine

## 2022-02-24 DIAGNOSIS — J189 Pneumonia, unspecified organism: Secondary | ICD-10-CM

## 2022-03-14 DIAGNOSIS — R9389 Abnormal findings on diagnostic imaging of other specified body structures: Secondary | ICD-10-CM | POA: Diagnosis not present

## 2022-03-14 DIAGNOSIS — R0789 Other chest pain: Secondary | ICD-10-CM | POA: Diagnosis not present

## 2022-03-14 DIAGNOSIS — C499 Malignant neoplasm of connective and soft tissue, unspecified: Secondary | ICD-10-CM | POA: Diagnosis not present

## 2022-03-16 ENCOUNTER — Other Ambulatory Visit: Payer: Self-pay | Admitting: Family Medicine

## 2022-03-16 ENCOUNTER — Telehealth: Payer: Self-pay | Admitting: *Deleted

## 2022-03-16 ENCOUNTER — Ambulatory Visit
Admission: RE | Admit: 2022-03-16 | Discharge: 2022-03-16 | Disposition: A | Discharge status: Home / Self Care | Payer: Medicare Other | Source: Ambulatory Visit | Attending: Family Medicine | Admitting: Family Medicine

## 2022-03-16 DIAGNOSIS — Z9889 Other specified postprocedural states: Secondary | ICD-10-CM | POA: Diagnosis not present

## 2022-03-16 DIAGNOSIS — R918 Other nonspecific abnormal finding of lung field: Secondary | ICD-10-CM | POA: Diagnosis not present

## 2022-03-16 DIAGNOSIS — J984 Other disorders of lung: Secondary | ICD-10-CM | POA: Diagnosis not present

## 2022-03-16 DIAGNOSIS — R9389 Abnormal findings on diagnostic imaging of other specified body structures: Secondary | ICD-10-CM

## 2022-03-16 NOTE — Telephone Encounter (Signed)
PC to patient, informed him of Dr. Hazeline Junker advice as below, he verbalizes understanding.

## 2022-03-16 NOTE — Telephone Encounter (Signed)
-----   Message from Wyatt Portela, MD sent at 03/16/2022 11:40 AM EDT ----- Regarding: RE: Pt Appt Please let him know that I have reviewed his CT scan and there is no evidence of cancer to explain his pain.  He needs to follow-up with Dr. Barry Dienes as scheduled. ----- Message ----- From: Rolene Course, RN Sent: 03/16/2022  11:37 AM EDT To: Wyatt Portela, MD Subject: RE: Pt Appt                                    I called him, he says he has constant pain under the R side of his rib cage, it is a constant aching, has been going on for almost 2 months & is unable to sleep much at all.  He was seen in ED twice in May & was dx'd with pneumonia.  He has also lost some ROM with his R arm.  He had a CT this morning & has an appointment with his surgeon on 7/17.  He is very concerned & wants to know if he needs to see you sooner than September.  ----- Message ----- From: Wyatt Portela, MD Sent: 03/16/2022   9:51 AM EDT To: Rolene Course, RN; Lucia Bitter Subject: RE: Pt Appt                                    Judy: please call the patient and inquire. Thanks ----- Message ----- From: Lucia Bitter Sent: 03/16/2022   9:34 AM EDT To: Rolene Course, RN; Wyatt Portela, MD Subject: Pt Appt                                        Patient requesting an appt, says he has been having complications and was headed for an x-ray today? Please advise on scheduling, Thanks

## 2022-03-30 ENCOUNTER — Telehealth: Payer: Self-pay | Admitting: Oncology

## 2022-03-30 NOTE — Telephone Encounter (Signed)
Called patient regarding upcoming September appointments, left a voicemail. 

## 2022-04-03 DIAGNOSIS — C496 Malignant neoplasm of connective and soft tissue of trunk, unspecified: Secondary | ICD-10-CM | POA: Diagnosis not present

## 2022-04-03 DIAGNOSIS — R1011 Right upper quadrant pain: Secondary | ICD-10-CM | POA: Diagnosis not present

## 2022-04-04 ENCOUNTER — Other Ambulatory Visit (HOSPITAL_BASED_OUTPATIENT_CLINIC_OR_DEPARTMENT_OTHER): Payer: Self-pay | Admitting: General Surgery

## 2022-04-04 ENCOUNTER — Other Ambulatory Visit: Payer: Self-pay | Admitting: General Surgery

## 2022-04-04 DIAGNOSIS — R1011 Right upper quadrant pain: Secondary | ICD-10-CM

## 2022-04-07 ENCOUNTER — Ambulatory Visit (HOSPITAL_BASED_OUTPATIENT_CLINIC_OR_DEPARTMENT_OTHER)
Admission: RE | Admit: 2022-04-07 | Discharge: 2022-04-07 | Disposition: A | Discharge status: Home / Self Care | Payer: Medicare Other | Source: Ambulatory Visit | Attending: General Surgery | Admitting: General Surgery

## 2022-04-07 DIAGNOSIS — R1011 Right upper quadrant pain: Secondary | ICD-10-CM | POA: Diagnosis not present

## 2022-04-18 ENCOUNTER — Other Ambulatory Visit (HOSPITAL_COMMUNITY): Payer: Self-pay | Admitting: General Surgery

## 2022-04-18 ENCOUNTER — Other Ambulatory Visit: Payer: Self-pay | Admitting: General Surgery

## 2022-04-18 DIAGNOSIS — R1011 Right upper quadrant pain: Secondary | ICD-10-CM

## 2022-05-09 ENCOUNTER — Encounter (HOSPITAL_COMMUNITY)
Admission: RE | Admit: 2022-05-09 | Discharge: 2022-05-09 | Disposition: A | Discharge status: Home / Self Care | Payer: Medicare Other | Source: Ambulatory Visit | Attending: General Surgery | Admitting: General Surgery

## 2022-05-09 DIAGNOSIS — R1011 Right upper quadrant pain: Secondary | ICD-10-CM | POA: Insufficient documentation

## 2022-05-09 MED ORDER — TECHNETIUM TC 99M MEBROFENIN IV KIT
5.4000 | PACK | Freq: Once | INTRAVENOUS | Status: AC | PRN
  Administered 2022-05-09: 5.4 via INTRAVENOUS

## 2022-05-17 DIAGNOSIS — K219 Gastro-esophageal reflux disease without esophagitis: Secondary | ICD-10-CM | POA: Diagnosis not present

## 2022-05-17 DIAGNOSIS — I10 Essential (primary) hypertension: Secondary | ICD-10-CM | POA: Diagnosis not present

## 2022-05-17 DIAGNOSIS — E1165 Type 2 diabetes mellitus with hyperglycemia: Secondary | ICD-10-CM | POA: Diagnosis not present

## 2022-05-17 DIAGNOSIS — Z85831 Personal history of malignant neoplasm of soft tissue: Secondary | ICD-10-CM | POA: Diagnosis not present

## 2022-05-17 DIAGNOSIS — R109 Unspecified abdominal pain: Secondary | ICD-10-CM | POA: Diagnosis not present

## 2022-05-17 DIAGNOSIS — M546 Pain in thoracic spine: Secondary | ICD-10-CM | POA: Diagnosis not present

## 2022-05-17 DIAGNOSIS — E782 Mixed hyperlipidemia: Secondary | ICD-10-CM | POA: Diagnosis not present

## 2022-05-23 ENCOUNTER — Telehealth: Payer: Self-pay

## 2022-05-23 NOTE — Patient Outreach (Signed)
  Care Coordination   Initial Visit Note   05/23/2022 Name: Cory Maddox MRN: [id number] DOB: 1946-04-04  Cory Maddox is a 76 y.o. year old male who sees Shirline Frees, MD for primary care. I spoke with  Lenore Cordia by phone today.  What matters to the patients health and wellness today?  Patient did not wish to talk at this time.   Goals Addressed             This Visit's Progress    COMPLETED: Caree Coordination Activites -no follow up required                SDOH assessments and interventions completed:  No     Care Coordination Interventions Activated:  No  Care Coordination Interventions:  No, not indicated   Follow up plan: No further intervention required.   Encounter Outcome:  Pt. Visit Completed   Lazaro Arms RN, BSN, [facility]   Phone: [phone]

## 2022-06-06 ENCOUNTER — Other Ambulatory Visit: Payer: Medicare Other

## 2022-06-06 DIAGNOSIS — H2512 Age-related nuclear cataract, left eye: Secondary | ICD-10-CM | POA: Diagnosis not present

## 2022-06-06 DIAGNOSIS — H2513 Age-related nuclear cataract, bilateral: Secondary | ICD-10-CM | POA: Diagnosis not present

## 2022-06-06 DIAGNOSIS — H25013 Cortical age-related cataract, bilateral: Secondary | ICD-10-CM | POA: Diagnosis not present

## 2022-06-06 DIAGNOSIS — E113291 Type 2 diabetes mellitus with mild nonproliferative diabetic retinopathy without macular edema, right eye: Secondary | ICD-10-CM | POA: Diagnosis not present

## 2022-06-06 DIAGNOSIS — H25043 Posterior subcapsular polar age-related cataract, bilateral: Secondary | ICD-10-CM | POA: Diagnosis not present

## 2022-06-06 DIAGNOSIS — E113212 Type 2 diabetes mellitus with mild nonproliferative diabetic retinopathy with macular edema, left eye: Secondary | ICD-10-CM | POA: Diagnosis not present

## 2022-06-09 ENCOUNTER — Telehealth: Payer: Self-pay

## 2022-06-09 NOTE — Telephone Encounter (Signed)
Pt called to confirm apt with Dr. Alen Blew on 06/13/22 @ 3:30 PM.

## 2022-06-12 DIAGNOSIS — E113212 Type 2 diabetes mellitus with mild nonproliferative diabetic retinopathy with macular edema, left eye: Secondary | ICD-10-CM | POA: Diagnosis not present

## 2022-06-12 DIAGNOSIS — H2512 Age-related nuclear cataract, left eye: Secondary | ICD-10-CM | POA: Diagnosis not present

## 2022-06-12 DIAGNOSIS — H43392 Other vitreous opacities, left eye: Secondary | ICD-10-CM | POA: Diagnosis not present

## 2022-06-13 ENCOUNTER — Inpatient Hospital Stay: Payer: Medicare Other | Attending: Oncology | Admitting: Oncology

## 2022-06-13 ENCOUNTER — Other Ambulatory Visit: Payer: Self-pay

## 2022-06-13 VITALS — BP 126/73 | HR 79 | Temp 98.0°F | Resp 17 | Ht 67.0 in | Wt 146.6 lb

## 2022-06-13 DIAGNOSIS — R0789 Other chest pain: Secondary | ICD-10-CM | POA: Diagnosis not present

## 2022-06-13 DIAGNOSIS — C493 Malignant neoplasm of connective and soft tissue of thorax: Secondary | ICD-10-CM | POA: Diagnosis not present

## 2022-06-13 DIAGNOSIS — Z85831 Personal history of malignant neoplasm of soft tissue: Secondary | ICD-10-CM | POA: Diagnosis not present

## 2022-06-13 NOTE — Progress Notes (Signed)
Hematology and Oncology Follow Up for Telemedicine Visits  Cory Maddox [id number] [birth year] 76 y.o. 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   Prior Therapy:  He underwent surgical resection under the care of Dr. Barry Dienes on July 27, 2021.  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  The pathological stage was T3a.    He is status post adjuvant radiation therapy completed on November 04, 2021.  He received total 30 fractions to deliver 60 Cory Maddox  Current therapy: Active surveillance.  Interim History: Cory Maddox returns today for a follow-up.  Returns today for a follow-up visit.  Since the last visit, he has reported continued discomfort in the right chest wall and flank since May 2023.  Imaging studies including chest x-ray and CT scan did not show any evidence of acute pathology.  Despite this pain he is able to participate in most activities of daily living and play golf.    Medications: Updated on review. Current Outpatient Medications  Medication Sig Dispense Refill   atorvastatin (LIPITOR) 40 MG tablet Take 1 tablet (40 mg total) by mouth daily. (Patient not taking: Reported on 07/22/2021) 90 tablet 0   B-D UF III MINI PEN NEEDLES 31G X 5 MM MISC USE TO CHECK BLOOD SUGAR BID  3   Blood Glucose Monitoring Suppl (ACCU-CHEK GUIDE) w/Device KIT U UTD TO CHECK BLOOD SUGAR  0   Blood Pressure Monitoring (BLOOD PRESSURE MONITOR AUTOMAT) DEVI TO CHECK BLOOD PRESSURE TWICE DAILY AS DIRECTED 1 Device 0   gabapentin (NEURONTIN) 100 MG capsule Take 1 capsule (100 mg total) by mouth 2 (two) times daily. 28 capsule 1   glipiZIDE (GLUCOTROL) 5 MG tablet Take 5 mg by mouth every morning. (Patient not taking: Reported on 09/06/2021)      glucose blood (ACCU-CHEK GUIDE) test strip Use to check blood sugar twice a day and prn 100 each 12   insulin glargine (LANTUS) 100 UNIT/ML injection Inject 15 Units into the skin daily.     levocetirizine (XYZAL) 5 MG tablet Take 5 mg by mouth every evening.  4   lisinopril (ZESTRIL) 40 MG tablet Take 40 mg by mouth daily.     metFORMIN (GLUCOPHAGE) 850 MG tablet Take 850 mg by mouth 2 (two) times daily with a meal.     Misc Natural Products (GINSENG COMPLEX PO) Take 1 capsule by mouth daily. (Patient not taking: Reported on 09/06/2021)     Omega-3 Fatty Acids (FISH OIL) 1000 MG CAPS Take 1,000 mg by mouth daily.     pantoprazole (PROTONIX) 20 MG tablet Take 20 mg by mouth daily. (Patient not taking: Reported on 09/06/2021)  2   No current facility-administered medications for this visit.     Allergies:  Allergies  Allergen Reactions   Aciphex [Rabeprazole] Other (See Comments)   Lisinopril     Other reaction(s): lightheaded   Pravastatin Other (See Comments)   Simvastatin Other (See Comments)   Physical exam:  Blood pressure 126/73, pulse 79, temperature 98 F (36.7 C), temperature source Temporal, resp. rate 17, height $RemoveBe'5\' 7"'KUMMXrFEf$  (1.702 m), weight 146 lb 9.6 oz (66.5 kg), SpO2 100 %.   General appearance: Comfortable appearing without any discomfort Head: Normocephalic without any trauma Oropharynx: Mucous membranes are moist and pink without any thrush  or ulcers. Eyes: Pupils are equal and round reactive to light. Lymph nodes: No cervical, supraclavicular, inguinal or axillary lymphadenopathy.   Heart:regular rate and rhythm.  S1 and S2 without leg edema. Lung: Clear without any rhonchi or wheezes.  No dullness to percussion. Chest wall examination did not show any protrusions or masses. Abdomin: Soft, nontender, nondistended with good bowel sounds.  No hepatosplenomegaly. Musculoskeletal: No joint deformity or effusion.  Full range of motion noted. Neurological: No deficits  noted on motor, sensory and deep tendon reflex exam. Skin: No petechial rash or dryness.  Appeared moist.      Lab Results: Lab Results  Component Value Date   WBC 4.8 01/21/2022   HGB 11.7 (L) 01/21/2022   HCT 35.7 (L) 01/21/2022   MCV 86.0 01/21/2022   PLT 375 01/21/2022     Chemistry      Component Value Date/Time   NA 135 01/21/2022 1054   K 4.7 01/21/2022 1054   CL 101 01/21/2022 1054   CO2 26 01/21/2022 1054   BUN 21 01/21/2022 1054   CREATININE 1.27 (H) 01/21/2022 1054   CREATININE 1.23 12/26/2021 0948      Component Value Date/Time   CALCIUM 9.0 01/21/2022 1054   ALKPHOS 81 01/21/2022 1054   AST 18 01/21/2022 1054   AST 24 12/26/2021 0948   ALT 19 01/21/2022 1054   ALT 35 12/26/2021 0948   BILITOT 0.6 01/21/2022 1054   BILITOT 0.5 12/26/2021 0948     IMPRESSION: 1. There is an area of mixed airspace and ground-glass attenuation extending to the left lung base. This is nonspecific and may represent an area of inflammation or infection differential considerations include: recurrent aspiration as well as hypoventilatory changes secondary to postsurgical change. Correlation for any clinical signs or symptoms of pneumonia recommended. In the absence of resolution consider referral to thoracic surgery for further management. 2. Postsurgical changes in the right posterior chest wall with persistent thin walled fluid collection within the surgical bed. If there are clinical signs or symptoms of infection percutaneous ultrasound-guided needle aspiration may be indicated to exclude underlying infection. 3. Multiple subacute fracture deformities involving the T4, T5 and T6 spinous processes.    Impression and Plan:  76 year old with:  1.  Chest wall sarcoma diagnosed in November 2022.  He was found to have T3a undifferentiated tumor.   He is currently on active surveillance without any need for any additional treatment unless he has metastatic disease in  the future.  Imaging studies obtained in June 2023 showed no evidence of metastasis.  Recommended repeat imaging studies in 3 months for active surveillance.  Anthracycline-based regimen could be considered if he has metastatic disease.  2.  Chest wall pain: Unclear etiology without any evidence of clear-cut metastasis noted at this time.      3.  Follow-up: In 3 months for follow-up evaluation.         30  minutes were dedicated to this visit. The time was spent on reviewing laboratory data, imaging studies, discussing treatment options, and answering questions regarding future plan.   Zola Button, MD 06/13/2022 3:22 PM

## 2022-06-15 DIAGNOSIS — H43821 Vitreomacular adhesion, right eye: Secondary | ICD-10-CM | POA: Diagnosis not present

## 2022-06-15 DIAGNOSIS — E113293 Type 2 diabetes mellitus with mild nonproliferative diabetic retinopathy without macular edema, bilateral: Secondary | ICD-10-CM | POA: Diagnosis not present

## 2022-06-19 ENCOUNTER — Encounter: Payer: Self-pay | Admitting: Internal Medicine

## 2022-06-20 DIAGNOSIS — E113292 Type 2 diabetes mellitus with mild nonproliferative diabetic retinopathy without macular edema, left eye: Secondary | ICD-10-CM | POA: Diagnosis not present

## 2022-06-20 DIAGNOSIS — H43821 Vitreomacular adhesion, right eye: Secondary | ICD-10-CM | POA: Diagnosis not present

## 2022-06-20 DIAGNOSIS — E113211 Type 2 diabetes mellitus with mild nonproliferative diabetic retinopathy with macular edema, right eye: Secondary | ICD-10-CM | POA: Diagnosis not present

## 2022-07-07 DIAGNOSIS — C496 Malignant neoplasm of connective and soft tissue of trunk, unspecified: Secondary | ICD-10-CM | POA: Diagnosis not present

## 2022-07-11 DIAGNOSIS — H43821 Vitreomacular adhesion, right eye: Secondary | ICD-10-CM | POA: Diagnosis not present

## 2022-07-11 DIAGNOSIS — E113213 Type 2 diabetes mellitus with mild nonproliferative diabetic retinopathy with macular edema, bilateral: Secondary | ICD-10-CM | POA: Diagnosis not present

## 2022-07-12 ENCOUNTER — Other Ambulatory Visit: Payer: Self-pay | Admitting: General Surgery

## 2022-07-12 DIAGNOSIS — C496 Malignant neoplasm of connective and soft tissue of trunk, unspecified: Secondary | ICD-10-CM

## 2022-07-21 NOTE — Therapy (Signed)
OUTPATIENT PHYSICAL THERAPY ONCOLOGY EVALUATION  Patient Name: Cory Maddox MRN: [id number] DOB:11-17-1945, 76 y.o., male Today's Date: 07/24/2022   PT End of Session - 07/24/22 1504     Visit Number 1    Number of Visits 16    Date for PT Re-Evaluation 09/18/22    PT Start Time 5465    PT Stop Time 1503    PT Time Calculation (min) 55 min    Activity Tolerance Patient tolerated treatment well    Behavior During Therapy [facility] for tasks assessed/performed             Past Medical History:  Diagnosis Date   Arthritis    Diabetes mellitus without complication ([place])    GERD (gastroesophageal reflux disease)    History of shingles    Hypertension    Sarcoma ([place]) 08/15/2021   Past Surgical History:  Procedure Laterality Date   MASS EXCISION Right 07/27/2021   Procedure: RADICAL EXCISION OF MALIGNANT MASS RIGHT BACK;  Surgeon: Stark Klein, MD;  Location: [facility];  Service: General;  Laterality: Right;   Patient Active Problem List   Diagnosis Date Noted   Sarcoma of chest wall ([place]) 09/06/2021   Hypertension    History of shingles    Diabetes mellitus without complication ([place])    Arthritis     PCP: Shirline Frees, MD  REFERRING PROVIDER: Stark Klein MD  REFERRING DIAG: s/p Excision Right Shoulder/Back Sarcoma with radiation  THERAPY DIAG:  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 It feels like the burning sensation he had with  shingles. He is undergoing multiple scans for this. In March he was laying on his back and putting legs on a table and when he went to sit up he had pain in the same area and got sick on his stomach. He is here now  for decreased right shoulder ROM after his surgery. I can't reach behind my back, and I can't bring my club back to the area that I need to for golf. Can't put in belt or reach into back pocket. He has not had any therapy since his surgery. The  last week of radiation is when I started to feel weakness/ fatigue. I have been going to the gym to get stronger but my ROM doesn't seem to improve.  PERTINENT HISTORY:  Pt underwent resection  of a right shoulder/back sarcoma on 07/27/21. This was a high grade undifferentiated pleomorphic sarcoma. Margins were negative but close. He had minimal pain. He developed lower extremity edema and went to the ED. He was found to have a left tibial vein clot and was placed on eliquis. The tumor was high grade and he underwent radiation finishing on 11/04/2021 He has also had several months since May 6 of significant right upper quadrant pain and decreased right shoulder ROM. He had multiple scans that did not show tumor reoccurence.   PAIN:  Are you having pain? Yes NPRS scale: 2/10 Pain location: right lower scapula Pain orientation: Right  PAIN TYPE: tight Pain description: intermittent  Aggravating factors: sitting against back of chair Relieving factors: nothing.  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[employer]  LEISURE: golf, outdoor activities, yard work, walking,  gym 3 days per week  HAND DOMINANCE: right   PRIOR LEVEL OF FUNCTION: Independent  PATIENT GOALS: Improve ROM of right shoulder   OBJECTIVE:  COGNITION: Overall cognitive status: Within functional limits for tasks assessed   PALPATION: Tender along medial border of right scapula over incisional area and slightly lateral to incision, decreased scar and scapular mobility  OBSERVATIONS / OTHER ASSESSMENTS: 16 cm incison healed from upper scapula along medial scapular border to inferior scapula  SENSATION: Light touch: Deficits   POSTURE: forward head, rounded shoulders  UPPER EXTREMITY AROM/PROM:  A/PROM RIGHT   eval   Shoulder extension 36  Shoulder flexion 108 scapular compensation   Shoulder abduction 120 with compensation  Shoulder internal rotation 42  Shoulder external rotation 63    (Blank rows = not tested)  A/PROM LEFT   eval  Shoulder extension 52  Shoulder flexion 140  Shoulder abduction 132  Shoulder internal rotation 50  Shoulder external rotation 71    (Blank rows = not tested)  CERVICAL AROM: All within functional limits:      UPPER EXTREMITY STRENGTH: WNL but noted right UT compensation with overhead activities  LYMPHEDEMA ASSESSMENTS:   SURGERY TYPE/DATE: 07/27/2021  NUMBER OF LYMPH NODES REMOVED: 0  CHEMOTHERAPY: no  RADIATION:Yes ended 10/2021  HORMONE TREATMENT: NO  INFECTIONS: NO     QUICK DASH SURVEY: 20%   TODAY'S TREATMENT:  DATE: Supine wand flexion, posterior shoulder stretch , IR with towel behind back or hands or golf club, shoulder extension with dowel, scapular retraction. Also showed wall slide for abd but forgot to give pictures  PATIENT EDUCATION:  Education details: Access Code: [email or web address] URL: [email or web address] Date: 07/24/2022 Prepared by: Cheral Almas  Exercises - Supine Shoulder Flexion with Dowel  - 2 x daily - 7 x weekly - 1 sets - 5 reps - 10-15 hold - Standing Shoulder Extension with Dowel  - 2 x daily - 7 x weekly - 1 sets - 5 reps - 10-15 hold - Seated Scapular Retraction  - 2 x daily - 7 x weekly - 1 sets - 10 reps - 3 hold - Single Arm Shoulder Post Capsule Stretch  - 2 x daily - 7 x weekly - 1 sets - 3 reps - 15 hold - Standing Shoulder Internal Rotation Stretch with Hands Behind Back  - 2 x daily - 7 x weekly - 1 sets - 5 reps - 10-15 hold Person educated: Patient Education method: Explanation, Demonstration, and Handouts Education comprehension: verbalized understanding and returned demonstration  HOME EXERCISE PROGRAM:  Supine Shoulder Flexion with Dowel  - 2 x daily - 7 x weekly - 1 sets - 5 reps - 10-15 hold - Standing Shoulder Extension with Dowel  - 2 x daily - 7 x weekly - 1 sets - 5 reps - 10-15 hold - Seated Scapular Retraction  - 2 x daily - 7 x weekly - 1 sets - 10 reps - 3 hold - Single Arm Shoulder Post Capsule Stretch  - 2 x daily - 7 x weekly - 1 sets - 3 reps - 15 hold - Standing Shoulder Internal Rotation Stretch with Hands Behind Back  - 2 x daily - 7 x weekly - 1 sets - 5 reps - 10-15 hold   ASSESSMENT:  CLINICAL IMPRESSION: Patient is a 76 y.o. male who was seen today for physical therapy evaluation and treatment for limitations in right shoulder ROM s/p a sarcoma removal from the right upper back/posterior shoulder on 07/27/2021, followed by radiation which ended in February of 2023. He presents with limitations in right shoulder ROM with UT compensation, scar tissue formation at medial scapular border from incision, and limitations in function secondary to ROM restrictions. He will benefit from skilled PT to address deficits and return to PLOF. He is also having Right UQ abdominal pain for unknown reasons and is going through multiple tests/scans  OBJECTIVE IMPAIRMENTS: decreased knowledge of  condition, decreased ROM, decreased strength, impaired flexibility, impaired sensation, impaired UE functional use, and pain.   ACTIVITY LIMITATIONS: reach over head  PARTICIPATION LIMITATIONS:  golf  PERSONAL FACTORS: 1-2 comorbidities: Right upper back/shoulder sarcoma s/p radiation, DM  are also affecting patient's functional outcome.   REHAB POTENTIAL: Good  CLINICAL DECISION MAKING: Stable/uncomplicated  EVALUATION COMPLEXITY: Low  GOALS: Goals reviewed with patient? Yes  SHORT TERM GOALS: Target date: 08/21/2022    Pt will be independent in a HEP to improve shoulder ROM and strength Baseline: Goal status: INITIAL  2.  Pt will improve right shoulder AROM flexion and abd by 10-15 degrees Baseline: flex 108, abd 120 Goal status: INITIAL  3.  Pt will have right shoulder extension increased to 46 degrees Baseline: 36 Goal status: INITIAL  4  Pt will be able to put a belt in normally  Baseline:unable  Goal Status:initial     LONG TERM GOALS: Target date: 09/18/2022  Pt will be able to put his hand/wallet in his right back pocket Baseline: unable Goal status: INITIAL  2.  Pt. Will note improved back swing with golf by atleast 50% Baseline:  Goal status: INITIAL  3.  Right shoulder flexion and abduction will improve to 130 degrees for improved reaching ability Baseline: flex 108, abd 120 Goal status: INITIAL  4.  Quick dash will be improved to no greater than 1% Baseline:  Goal status: INITIAL 5. Pt will have decreased UT/scapular compensation for improved overhead reaching PLAN:  Baseline:    Goal;INITAL  PT FREQUENCY: 2x/week  PT DURATION: 8 weeks  PLANNED INTERVENTIONS: Therapeutic exercises, Therapeutic activity, Neuromuscular re-education, Patient/Family education, Self Care, Joint mobilization, Dry Needling, Spinal mobilization, scar mobilization, and Manual therapy  PLAN FOR NEXT SESSION: scar mobilization right scapular area, scapular and joint  mobs, progress AA exs, review HEP given today, STM prn around scapular/lat area,progress to scapular stability, UE strength.   Claris Pong, PT 07/24/2022, 3:06 PM

## 2022-07-24 ENCOUNTER — Other Ambulatory Visit: Payer: Self-pay

## 2022-07-24 ENCOUNTER — Ambulatory Visit: Payer: Medicare Other | Admitting: Physical Therapy

## 2022-07-24 ENCOUNTER — Ambulatory Visit: Payer: Medicare Other | Attending: General Surgery

## 2022-07-24 DIAGNOSIS — M25611 Stiffness of right shoulder, not elsewhere classified: Secondary | ICD-10-CM | POA: Diagnosis not present

## 2022-07-24 DIAGNOSIS — C768 Malignant neoplasm of other specified ill-defined sites: Secondary | ICD-10-CM | POA: Diagnosis not present

## 2022-07-24 DIAGNOSIS — Z483 Aftercare following surgery for neoplasm: Secondary | ICD-10-CM | POA: Diagnosis not present

## 2022-07-24 DIAGNOSIS — C496 Malignant neoplasm of connective and soft tissue of trunk, unspecified: Secondary | ICD-10-CM | POA: Insufficient documentation

## 2022-07-24 DIAGNOSIS — Z923 Personal history of irradiation: Secondary | ICD-10-CM | POA: Diagnosis not present

## 2022-07-25 DIAGNOSIS — E1165 Type 2 diabetes mellitus with hyperglycemia: Secondary | ICD-10-CM | POA: Diagnosis not present

## 2022-07-25 DIAGNOSIS — E782 Mixed hyperlipidemia: Secondary | ICD-10-CM | POA: Diagnosis not present

## 2022-07-25 DIAGNOSIS — R1011 Right upper quadrant pain: Secondary | ICD-10-CM | POA: Diagnosis not present

## 2022-07-25 DIAGNOSIS — Z85831 Personal history of malignant neoplasm of soft tissue: Secondary | ICD-10-CM | POA: Diagnosis not present

## 2022-07-25 DIAGNOSIS — K219 Gastro-esophageal reflux disease without esophagitis: Secondary | ICD-10-CM | POA: Diagnosis not present

## 2022-07-25 DIAGNOSIS — I10 Essential (primary) hypertension: Secondary | ICD-10-CM | POA: Diagnosis not present

## 2022-07-28 ENCOUNTER — Ambulatory Visit: Payer: Medicare Other

## 2022-07-28 DIAGNOSIS — Z483 Aftercare following surgery for neoplasm: Secondary | ICD-10-CM

## 2022-07-28 DIAGNOSIS — C496 Malignant neoplasm of connective and soft tissue of trunk, unspecified: Secondary | ICD-10-CM

## 2022-07-28 DIAGNOSIS — M25611 Stiffness of right shoulder, not elsewhere classified: Secondary | ICD-10-CM

## 2022-07-28 DIAGNOSIS — Z923 Personal history of irradiation: Secondary | ICD-10-CM | POA: Diagnosis not present

## 2022-07-28 DIAGNOSIS — C768 Malignant neoplasm of other specified ill-defined sites: Secondary | ICD-10-CM | POA: Diagnosis not present

## 2022-07-28 NOTE — Therapy (Signed)
OUTPATIENT PHYSICAL THERAPY ONCOLOGY EVALUATION  Patient Name: Cory Maddox MRN: [id number] DOB:15-Feb-1946, 76 y.o., male Today's Date: 07/28/2022   PT End of Session - 07/28/22 1055     Visit Number 2    Number of Visits 16    Date for PT Re-Evaluation 09/18/22    PT Start Time 0712    PT Stop Time 1975    PT Time Calculation (min) 55 min    Activity Tolerance Patient tolerated treatment well    Behavior During Therapy [facility] for tasks assessed/performed             Past Medical History:  Diagnosis Date   Arthritis    Diabetes mellitus without complication ([place])    GERD (gastroesophageal reflux disease)    History of shingles    Hypertension    Sarcoma ([place]) 08/15/2021   Past Surgical History:  Procedure Laterality Date   MASS EXCISION Right 07/27/2021   Procedure: RADICAL EXCISION OF MALIGNANT MASS RIGHT BACK;  Surgeon: Stark Klein, MD;  Location: [place];  Service: General;  Laterality: Right;   Patient Active Problem List   Diagnosis Date Noted   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   Hypertension    History of shingles    Diabetes mellitus without complication ([place])    Arthritis     PCP: Shirline Frees, MD  REFERRING PROVIDER: Stark Klein MD  REFERRING DIAG: s/p Excision Right Shoulder/Back Sarcoma with radiation  THERAPY DIAG:  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 I noticed my golf swing was better than where I was at.  PERTINENT HISTORY:  Pt underwent resection  of a right shoulder/back sarcoma on 07/27/21.  This was a high grade undifferentiated pleomorphic sarcoma. Margins were negative but close. He had minimal pain. He developed lower extremity edema and went to the ED. He was found to have a left tibial vein clot and was placed on eliquis. The tumor was high grade and he underwent radiation finishing on 11/04/2021 He has also had several months since May 6 of significant right upper quadrant pain and decreased right shoulder ROM. He had multiple scans that did not show tumor reoccurence.   PAIN:  Are you having  pain? Yes NPRS scale: 2/10 Pain location: right lower scapula Pain orientation: Right  PAIN TYPE: tight Pain description: intermittent  Aggravating factors: sitting against back of chair Relieving factors: nothing.  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[employer]  LEISURE: golf, outdoor activities, yard work, walking, gym 3 days per week  HAND DOMINANCE: right   PRIOR LEVEL OF FUNCTION: Independent  PATIENT GOALS: Improve ROM of right shoulder   OBJECTIVE:  COGNITION: Overall cognitive status: Within functional limits for tasks assessed   PALPATION: Tender along medial border of right scapula over incisional area and slightly lateral to incision, decreased scar and scapular mobility  OBSERVATIONS / OTHER ASSESSMENTS: 16 cm incison healed from upper scapula along medial scapular border to inferior scapula  SENSATION: Light touch: Deficits   POSTURE: forward head, rounded shoulders  UPPER EXTREMITY AROM/PROM:  A/PROM RIGHT   eval   Shoulder extension 36  Shoulder flexion 108  scapular compensation   Shoulder abduction 120 with compensation  Shoulder internal rotation 42  Shoulder external rotation 63    (Blank rows = not tested)  A/PROM LEFT   eval  Shoulder extension 52  Shoulder flexion 140  Shoulder abduction 132  Shoulder internal rotation 50  Shoulder external rotation 71    (Blank rows = not tested)  CERVICAL AROM: All within functional limits:      UPPER EXTREMITY STRENGTH: WNL but noted right UT compensation with overhead activities  LYMPHEDEMA ASSESSMENTS:   SURGERY TYPE/DATE: 07/27/2021  NUMBER OF LYMPH NODES REMOVED: 0  CHEMOTHERAPY: no  RADIATION:Yes ended 10/2021  HORMONE TREATMENT: NO  INFECTIONS: NO     QUICK DASH SURVEY: 20%   TODAY'S TREATMENT:      07/28/2022                                                                                                                       In left SL;Scar massage at right scapular incision, Soft tissue mobilization to same and to lats. Scapular mobs elevation/depression, protraction retraction Supine wand flexion and scaption x 3-5 reps PROM right shoulder flexion, scaption, abd, ER and IR Standing IR stretch with towelx5, instructed single arm pec wall stretch in doorway   07/24/2022: Supine wand flexion, posterior shoulder stretch , IR with towel behind back or hands or golf club, shoulder extension with dowel, scapular retraction. Also showed wall slide for abd but forgot to give pictures  PATIENT EDUCATION:  Education details: Access Code: [email or web address] URL: [email or web address] Date: 07/24/2022 Prepared by: Cheral Almas  Exercises - Supine Shoulder Flexion with Dowel  - 2 x daily - 7 x  weekly - 1 sets - 5 reps - 10-15 hold - Standing Shoulder Extension with Dowel  - 2 x daily - 7 x weekly - 1 sets - 5 reps - 10-15 hold - Seated Scapular Retraction  - 2 x daily - 7 x weekly - 1 sets - 10 reps - 3 hold - Single Arm Shoulder Post Capsule Stretch  - 2 x daily -  7 x weekly - 1 sets - 3 reps - 15 hold - Standing Shoulder Internal Rotation Stretch with Hands Behind Back  - 2 x daily - 7 x weekly - 1 sets - 5 reps - 10-15 hold Person educated: Patient Education method: Explanation, Demonstration, and Handouts Education comprehension: verbalized understanding and returned demonstration  HOME EXERCISE PROGRAM:  Supine Shoulder Flexion with Dowel  - 2 x daily - 7 x weekly - 1 sets - 5 reps - 10-15 hold - Standing Shoulder Extension with Dowel  - 2 x daily - 7 x weekly - 1 sets - 5 reps - 10-15 hold - Seated Scapular Retraction  - 2 x daily - 7 x weekly - 1 sets - 10 reps - 3 hold - Single Arm Shoulder Post Capsule Stretch  - 2 x daily - 7 x weekly - 1 sets - 3 reps - 15 hold - Standing Shoulder Internal Rotation Stretch with Hands Behind Back  - 2 x daily - 7 x weekly - 1 sets - 5 reps - 10-15 hold   ASSESSMENT:  CLINICAL IMPRESSION: Pt is very motivated. He is very compliant with his HEP and already is feeling much looser. He was exceptionally restricted in the scapular area from his incision and radiation and was  tight in pectorals today and may benefit from manual work there as well. OBJECTIVE IMPAIRMENTS: decreased knowledge of condition, decreased ROM, decreased strength, impaired flexibility, impaired sensation, impaired UE functional use, and pain.   ACTIVITY LIMITATIONS: reach over head  PARTICIPATION LIMITATIONS:  golf  PERSONAL FACTORS: 1-2 comorbidities: Right upper back/shoulder sarcoma s/p radiation, DM  are also affecting patient's functional outcome.   REHAB POTENTIAL: Good  CLINICAL DECISION MAKING: Stable/uncomplicated  EVALUATION COMPLEXITY: Low  GOALS: Goals reviewed with patient? Yes  SHORT TERM GOALS: Target date: 08/25/2022    Pt will be independent in a HEP to improve shoulder ROM and strength Baseline: Goal status: INITIAL  2.  Pt will improve right shoulder AROM flexion and abd by 10-15 degrees Baseline: flex 108,  abd 120 Goal status: INITIAL  3.  Pt will have right shoulder extension increased to 46 degrees Baseline: 36 Goal status: INITIAL  4  Pt will be able to put a belt in normally  Baseline:unable  Goal Status:initial     LONG TERM GOALS: Target date: 09/22/2022    Pt will be able to put his hand/wallet in his right back pocket Baseline: unable Goal status: INITIAL  2.  Pt. Will note improved back swing with golf by atleast 50% Baseline:  Goal status: INITIAL  3.  Right shoulder flexion and abduction will improve to 130 degrees for improved reaching ability Baseline: flex 108, abd 120 Goal status: INITIAL  4.  Quick dash will be improved to no greater than 1% Baseline:  Goal status: INITIAL 5. Pt will have decreased UT/scapular compensation for improved overhead reaching PLAN:  Baseline:    Goal;INITAL  PT FREQUENCY: 2x/week  PT DURATION: 8 weeks  PLANNED INTERVENTIONS: Therapeutic exercises, Therapeutic activity, Neuromuscular re-education, Patient/Family education, Self Care, Joint mobilization, Dry  Needling, Spinal mobilization, scar mobilization, and Manual therapy  PLAN FOR NEXT SESSION: scar mobilization right scapular area, scapular and joint mobs, progress AA exs, progress HEP prn, STM prn around scapular/lat area,progress to scapular stability,   Claris Pong, PT 07/28/2022, 11:56 AM

## 2022-08-01 ENCOUNTER — Ambulatory Visit (INDEPENDENT_AMBULATORY_CARE_PROVIDER_SITE_OTHER): Payer: Medicare Other | Admitting: Internal Medicine

## 2022-08-01 ENCOUNTER — Encounter: Payer: Self-pay | Admitting: Internal Medicine

## 2022-08-01 DIAGNOSIS — R194 Change in bowel habit: Secondary | ICD-10-CM

## 2022-08-01 DIAGNOSIS — R1011 Right upper quadrant pain: Secondary | ICD-10-CM

## 2022-08-01 DIAGNOSIS — D649 Anemia, unspecified: Secondary | ICD-10-CM | POA: Diagnosis not present

## 2022-08-01 DIAGNOSIS — K5900 Constipation, unspecified: Secondary | ICD-10-CM

## 2022-08-01 MED ORDER — NA SULFATE-K SULFATE-MG SULF 17.5-3.13-1.6 GM/177ML PO SOLN: 1.0000 | ORAL | 0 refills | Status: DC

## 2022-08-01 NOTE — Progress Notes (Signed)
Chief Complaint: RUQ ab pain  HPI : 76 year old male with history of DM, arthritis, GERD, and chest wall pleiomorphic sarcoma presents with RUQ ab pain  He started feeling RUQ ab pain starting in 01/2022. The pain was very severe in the beginning. Since that time, his ab pain has gotten slightly better. He is currently on Tylenol, gabapentin, and tramadol, which are helping with the ab pain. Prior to the ab pain starting, he had 2 BMs per day. Now he has one BM once every 2-4 days. Denies prior colonoscopy. He has done one stool test for colon cancer screening in the past. Denies melena or hematochezia. Weight can fluctuate up and down. Denies family history of GI cancers or GI issues. Denies N&V and dysphagia. He takes pantoprazole, which typically keeps his GERD under control.   Wt Readings from Last 3 Encounters:  08/01/22 154 lb 3.2 oz (69.9 kg)  06/13/22 146 lb 9.6 oz (66.5 kg)  02/01/22 152 lb (68.9 kg)   Past Medical History:  Diagnosis Date   Arthritis    Diabetes mellitus without complication ([facility])    GERD (gastroesophageal reflux disease)    History of shingles    Hypertension    Sarcoma ([place]) 08/15/2021   Past Surgical History:  Procedure Laterality Date   MASS EXCISION Right 07/27/2021   Procedure: RADICAL EXCISION OF MALIGNANT MASS RIGHT BACK;  Surgeon: Stark Klein, MD;  Location: [place];  Service: General;  Laterality: Right;   Family History  Problem Relation Age of Onset   Diabetes Mother    Social History   Tobacco Use   Smoking status: Never   Smokeless tobacco: Never  Vaping Use   Vaping Use: Never used  Substance Use Topics   Alcohol use: Not Currently    Alcohol/week: 5.0 standard drinks of alcohol    Types: 5 Glasses of wine per week   Drug use: No   Current Outpatient Medications  Medication Sig Dispense Refill   B-D UF III MINI PEN NEEDLES 31G X 5 MM MISC USE TO CHECK BLOOD SUGAR BID  3   Blood Glucose Monitoring Suppl (ACCU-CHEK GUIDE) w/Device  KIT U UTD TO CHECK BLOOD SUGAR  0   Blood Pressure Monitoring (BLOOD PRESSURE MONITOR AUTOMAT) DEVI TO CHECK BLOOD PRESSURE TWICE DAILY AS DIRECTED 1 Device 0   Continuous Blood Gluc Sensor (FREESTYLE LIBRE 3 SENSOR) MISC USE TO CHECK BLOOD SUGAR AS DIRECTED     glipiZIDE (GLUCOTROL) 5 MG tablet Take 5 mg by mouth daily before breakfast.     glucose blood (ACCU-CHEK GUIDE) test strip Use to check blood sugar twice a day and prn 100 each 12   insulin glargine (LANTUS) 100 UNIT/ML injection Inject 15 Units into the skin daily.     levocetirizine (XYZAL) 5 MG tablet Take 5 mg by mouth every evening.  4   lisinopril (ZESTRIL) 40 MG tablet Take 40 mg by mouth daily.     metFORMIN (GLUCOPHAGE) 850 MG tablet Take 850 mg by mouth 2 (two) times daily with a meal.     Omega-3 Fatty Acids (FISH OIL) 1000 MG CAPS Take 1,000 mg by mouth daily.     pantoprazole (PROTONIX) 20 MG tablet Take 20 mg by mouth daily.  2   gabapentin (NEURONTIN) 100 MG capsule Take 1 capsule (100 mg total) by mouth 2 (two) times daily. 28 capsule 1   Misc Natural Products (GINSENG COMPLEX PO) Take 1 capsule by mouth daily. (Patient not taking:  Reported on 09/06/2021)     No current facility-administered medications for this visit.   Allergies  Allergen Reactions   Aciphex [Rabeprazole] Other (See Comments)   Lisinopril     Other reaction(s): lightheaded   Pravastatin Other (See Comments)   Simvastatin Other (See Comments)   Review of Systems: All systems reviewed and negative except where noted in HPI.   Physical Exam: BP (!) 150/90 (BP Location: Left Arm, Patient Position: Sitting, Cuff Size: Normal)   Pulse 82   Ht _0  (1.702 m)   Wt 154 lb 3.2 oz (69.9 kg)   SpO2 98%   BMI 24.15 kg/m  Constitutional: Pleasant,well-developed, male in no acute distress. HEENT: Normocephalic and atraumatic. Conjunctivae are normal. No scleral icterus. Cardiovascular: Normal rate, regular rhythm.  Pulmonary/chest: Effort normal and  breath sounds normal. No wheezing, rales or rhonchi. Abdominal: Soft, nondistended, tender in the RUQ. Bowel sounds active throughout. There are no masses palpable. No hepatomegaly. Extremities: No edema Neurological: Alert and oriented to person place and time. Skin: Skin is warm and dry. No rashes noted. Psychiatric: Normal mood and affect. Behavior is normal.  Labs 01/21/22: CBC with low Hb of 11.7. CMP with elevated Cr of 1.27 and elevated glucose levels. Lipase nml.   CT c/a/p 01/02/2022 IMPRESSION: 1. Postsurgical change in the posterior right chest wall mass resection, with a thin walled fluid collection in the surgical bed measuring favored to reflect a seroma, without suspicious enhancing soft tissue to suggest local recurrence. 2. Scattered small right lower lobe pulmonary nodules measure up to 3 mm, nonspecific and favored to reflect an infectious or inflammatory process. However, metastatic disease not technically excluded. Consider attention on short-term follow-up chest CT. 3. No evidence of metastatic disease in the abdomen or pelvis. No thoracic adenopathy. 4. Moderate volume of formed stool throughout the colon. Correlate for constipation.   CT A/P w/contrast 01/21/22: IMPRESSION: No acute finding seen to explain pain. Mild fatty change of the liver. Small appendicoliths within the appendix but no sign of appendicitis.  RUQ U/S 04/07/22: IMPRESSION: 1. Minimal gallbladder sludge without additional evidence of acute cholecystitis. 2. No suspicious liver lesions.  HIDA scan 05/09/22: IMPRESSION: 1.  Patent cystic and common bile ducts.  ASSESSMENT AND PLAN: RUQ ab pain Change in bowel habits Constipation Anemia Patient presents with RUQ ab pain that has been present since 01/2022. He has had evaluation with RUQ U/S and HIDA scan that did not show clear signs of cholecystitis. He has been noted to have moderate amounts of stool burden on CTs, which could suggest  underlying constipation as a contributor to his ab pain. Will plan for further evaluation by performing EGD to rule out PUD and gastritis/duodenitis and a colonoscopy to evaluate his change in bowel habits. Will attempt to get his constipation under better control before his procedures. - Patient declined further anemia work up for now - Drink 8 cups of water per day, walk 30 minutes per day, daily lfiber supplement - Start daily Miralax - EGD/colonoscopy [facility]  Christia Reading, MD  I spent 62 minutes of time, including in depth chart review, independent review of results as outlined above, communicating results with the patient directly, face-to-face time with the patient, coordinating care, ordering studies and medications as appropriate, and documentation.

## 2022-08-01 NOTE — Patient Instructions (Addendum)
_______________________________________________________  If you are age 76 or older, your body mass index should be between 23-30. Your Body mass index is 24.15 kg/m. If this is out of the aforementioned range listed, please consider follow up with your Primary Care Provider.  The [facility] providers would like to encourage you to use [facility] to communicate with providers for non-urgent requests or questions.  Due to long hold times on the telephone, sending your provider a message by [facility] may be a faster and more efficient way to get a response.  Please allow 48 business hours for a response.  Please remember that this is for non-urgent requests.  _______________________________________________________  Cory Maddox have been scheduled for an endoscopy and colonoscopy. Please follow the written instructions given to you at your visit today. Please pick up your prep supplies at the pharmacy within the next 1-3 days. If you use inhalers (even only as needed), please bring them with you on the day of your procedure.  Due to recent changes in healthcare laws, you may see the results of your imaging and laboratory studies on MyChart before your provider has had a chance to review them.  We understand that in some cases there may be results that are confusing or concerning to you. Not all laboratory results come back in the same time frame and the provider may be waiting for multiple results in order to interpret others.  Please give [place] 48 hours in order for your provider to thoroughly review all the results before contacting the office for clarification of your results.   Drink 8 cups of water a day and walk 30 minutes a day.  Please purchase the following medications over the counter and take as directed: Fiber supplement such as Benefiber- use as directed daily Miralax: Take as  directed up to 3 times a day to achieve regular bowel movements  Thank you for entrusting me with your care and choosing  [facility].  Cory Maddox

## 2022-08-02 ENCOUNTER — Encounter: Payer: Self-pay | Admitting: Physical Therapy

## 2022-08-02 ENCOUNTER — Ambulatory Visit: Payer: Medicare Other | Admitting: Physical Therapy

## 2022-08-02 DIAGNOSIS — M25611 Stiffness of right shoulder, not elsewhere classified: Secondary | ICD-10-CM

## 2022-08-02 DIAGNOSIS — Z483 Aftercare following surgery for neoplasm: Secondary | ICD-10-CM

## 2022-08-02 DIAGNOSIS — C768 Malignant neoplasm of other specified ill-defined sites: Secondary | ICD-10-CM | POA: Diagnosis not present

## 2022-08-02 DIAGNOSIS — C496 Malignant neoplasm of connective and soft tissue of trunk, unspecified: Secondary | ICD-10-CM

## 2022-08-02 DIAGNOSIS — Z923 Personal history of irradiation: Secondary | ICD-10-CM | POA: Diagnosis not present

## 2022-08-02 NOTE — Therapy (Signed)
OUTPATIENT PHYSICAL THERAPY ONCOLOGY TREATMENT  Patient Name: Cory Maddox MRN: [id number] DOB:10-17-1945, 76 y.o., male Today's Date: 08/02/2022   PT End of Session - 08/02/22 0904     Visit Number 3    Number of Visits 16    Date for PT Re-Evaluation 09/18/22    PT Start Time 0903    PT Stop Time 0957    PT Time Calculation (min) 54 min    Activity Tolerance Patient tolerated treatment well    Behavior During Therapy [facility] for tasks assessed/performed             Past Medical History:  Diagnosis Date   Arthritis    Diabetes mellitus without complication ([place])    GERD (gastroesophageal reflux disease)    History of shingles    Hypertension    Sarcoma ([place]) 08/15/2021   Past Surgical History:  Procedure Laterality Date   MASS EXCISION Right 07/27/2021   Procedure: RADICAL EXCISION OF MALIGNANT MASS RIGHT BACK;  Surgeon: Stark Klein, MD;  Location: [place];  Service: General;  Laterality: Right;   Patient Active Problem List   Diagnosis Date Noted   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   Hypertension    History of shingles    Diabetes mellitus without complication ([place])    Arthritis     PCP: Shirline Frees, MD  REFERRING PROVIDER: Stark Klein MD  REFERRING DIAG: s/p Excision Right Shoulder/Back Sarcoma with radiation  THERAPY DIAG:  Aftercare following surgery for neoplasm  Stiffness of right shoulder, not elsewhere classified  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 I have been doing my exercises that Shirlean Mylar gave me.   PERTINENT HISTORY:  Pt underwent resection  of a right shoulder/back sarcoma on 07/27/21.  This was a high grade undifferentiated pleomorphic sarcoma. Margins were negative but close. He had minimal pain. He developed lower extremity edema and went to the ED. He was found to have a left tibial vein clot and was placed on eliquis. The tumor was high grade and he underwent radiation finishing on 11/04/2021 He has also had several months since May 6 of significant right upper quadrant pain and decreased right shoulder ROM. He had multiple scans that did not show tumor reoccurence.   PAIN:  Are you  having pain? Yes NPRS scale: 2/10 Pain location: right lower scapula Pain orientation: Right  PAIN TYPE: tight Pain description: intermittent  Aggravating factors: sitting against back of chair Relieving factors: nothing.  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[employer]  LEISURE: golf, outdoor activities, yard work, walking, gym 3 days per week  HAND DOMINANCE: right   PRIOR LEVEL OF FUNCTION: Independent  PATIENT GOALS: Improve ROM of right shoulder   OBJECTIVE:  COGNITION: Overall cognitive status: Within functional limits for tasks assessed   PALPATION: Tender along medial border of right scapula over incisional area and slightly lateral to incision, decreased scar and scapular mobility  OBSERVATIONS / OTHER ASSESSMENTS: 16 cm incison healed from upper scapula along medial scapular border to inferior scapula  SENSATION: Light touch: Deficits   POSTURE: forward head, rounded shoulders  UPPER EXTREMITY AROM/PROM:  A/PROM RIGHT   eval  RIGHT 08/02/22  Shoulder extension 36 44  Shoulder  flexion 108 scapular compensation  132  Shoulder abduction 120 with compensation 141  Shoulder internal rotation 42 25  Shoulder external rotation 63 70    (Blank rows = not tested)  A/PROM LEFT   eval  Shoulder extension 52  Shoulder flexion 140  Shoulder abduction 132  Shoulder internal rotation 50  Shoulder external rotation 71    (Blank rows = not tested)  CERVICAL AROM: All within functional limits:      UPPER EXTREMITY STRENGTH: WNL but noted right UT compensation with overhead activities  LYMPHEDEMA ASSESSMENTS:   SURGERY TYPE/DATE: 07/27/2021  NUMBER OF LYMPH NODES REMOVED: 0  CHEMOTHERAPY: no  RADIATION:Yes ended 10/2021  HORMONE TREATMENT: NO  INFECTIONS: NO     QUICK DASH SURVEY: 20%   TODAY'S TREATMENT:      08/02/22 In left SL;Scar massage at right scapular incision, Soft tissue mobilization to same and to lats. Scapular mobs elevation/depression, protraction retraction Suction cupping to R scapular area and around scar with greatly improved skin mobility noted afterwards PROM in supine in direction of right shoulder flexion, scaption, abd, ER and IR 07/28/2022                                                                                                                       In left SL;Scar massage at right scapular incision, Soft tissue mobilization to same and to lats. Scapular mobs elevation/depression, protraction retraction Supine wand flexion and scaption x 3-5 reps PROM right shoulder flexion, scaption, abd, ER and IR Standing IR stretch with towelx5, instructed single arm pec wall stretch in doorway   07/24/2022: Supine wand flexion, posterior shoulder stretch ,  IR with towel behind back or hands or golf club, shoulder extension with dowel, scapular retraction. Also showed wall slide for abd but forgot to give pictures  PATIENT EDUCATION:  Education details: Access Code: [email or web address] URL: [email or web address] Date:  07/24/2022 Prepared by: Cheral Almas  Exercises - Supine Shoulder Flexion with Dowel  - 2 x daily - 7 x weekly - 1 sets - 5 reps - 10-15 hold - Standing Shoulder Extension with Dowel  - 2 x daily - 7 x weekly - 1 sets - 5 reps - 10-15 hold - Seated Scapular Retraction  - 2 x daily - 7 x weekly - 1 sets - 10 reps - 3 hold - Single Arm Shoulder Post Capsule Stretch  - 2 x daily - 7 x weekly - 1 sets - 3 reps - 15 hold - Standing Shoulder Internal Rotation Stretch with Hands Behind Back  - 2 x daily - 7 x weekly - 1 sets - 5 reps - 10-15 hold Person educated: Patient Education method: Explanation, Demonstration, and Handouts Education comprehension: verbalized understanding and returned demonstration  HOME EXERCISE PROGRAM:  Supine Shoulder Flexion with Dowel  - 2 x daily - 7 x weekly - 1 sets - 5 reps - 10-15 hold - Standing Shoulder Extension with Dowel  - 2 x daily - 7 x weekly - 1 sets - 5 reps - 10-15 hold - Seated Scapular Retraction  - 2 x daily - 7 x weekly - 1 sets - 10 reps - 3 hold - Single Arm Shoulder Post Capsule Stretch  - 2 x daily - 7 x weekly - 1 sets - 3 reps - 15 hold - Standing Shoulder Internal Rotation Stretch with Hands Behind Back  - 2 x daily - 7 x weekly - 1 sets - 5 reps - 10-15 hold   ASSESSMENT:  CLINICAL IMPRESSION: Began using suction cups today to help improve skin/muscle mobility in area of R scapula with great improvement noted at the end. Remeasured R shoulder ROM at beginning of session and it has improved from eval. By end of session pt felt he had further improved ROM follow STM, PROM and suction cupping.   OBJECTIVE IMPAIRMENTS: decreased knowledge of condition, decreased ROM, decreased strength, impaired flexibility, impaired sensation, impaired UE functional use, and pain.   ACTIVITY LIMITATIONS: reach over head  PARTICIPATION LIMITATIONS:  golf  PERSONAL FACTORS: 1-2 comorbidities: Right upper back/shoulder sarcoma s/p radiation, DM  are also  affecting patient's functional outcome.   REHAB POTENTIAL: Good  CLINICAL DECISION MAKING: Stable/uncomplicated  EVALUATION COMPLEXITY: Low  GOALS: Goals reviewed with patient? Yes  SHORT TERM GOALS: Target date: 08/30/2022    Pt will be independent in a HEP to improve shoulder ROM and strength Baseline: Goal status: INITIAL  2.  Pt will improve right shoulder AROM flexion and abd by 10-15 degrees Baseline: flex 108, abd 120 Goal status: INITIAL  3.  Pt will have right shoulder extension increased to 46 degrees Baseline: 36 Goal status: INITIAL  4  Pt will be able to put a belt in normally  Baseline:unable  Goal Status:initial     LONG TERM GOALS: Target date: 09/27/2022    Pt will be able to put his hand/wallet in his right back pocket Baseline: unable Goal status: INITIAL  2.  Pt. Will note improved back swing with golf by atleast 50% Baseline:  Goal status: INITIAL  3.  Right shoulder flexion and abduction will improve to 130 degrees for improved reaching ability  Baseline: flex 108, abd 120 Goal status: INITIAL  4.  Quick dash will be improved to no greater than 1% Baseline:  Goal status: INITIAL 5. Pt will have decreased UT/scapular compensation for improved overhead reaching PLAN:  Baseline:    Goal;INITAL  PT FREQUENCY: 2x/week  PT DURATION: 8 weeks  PLANNED INTERVENTIONS: Therapeutic exercises, Therapeutic activity, Neuromuscular re-education, Patient/Family education, Self Care, Joint mobilization, Dry Needling, Spinal mobilization, scar mobilization, and Manual therapy  PLAN FOR NEXT SESSION: suction cupping to R scapular area, scar mobilization right scapular area, scapular and joint mobs, progress AA exs, progress HEP prn, STM prn around scapular/lat area,progress to scapular stability,   [employer], PT 08/02/2022, 10:21 AM

## 2022-08-07 ENCOUNTER — Ambulatory Visit: Payer: Medicare Other

## 2022-08-07 DIAGNOSIS — C496 Malignant neoplasm of connective and soft tissue of trunk, unspecified: Secondary | ICD-10-CM | POA: Diagnosis not present

## 2022-08-07 DIAGNOSIS — Z923 Personal history of irradiation: Secondary | ICD-10-CM | POA: Diagnosis not present

## 2022-08-07 DIAGNOSIS — Z483 Aftercare following surgery for neoplasm: Secondary | ICD-10-CM

## 2022-08-07 DIAGNOSIS — C768 Malignant neoplasm of other specified ill-defined sites: Secondary | ICD-10-CM | POA: Diagnosis not present

## 2022-08-07 DIAGNOSIS — M25611 Stiffness of right shoulder, not elsewhere classified: Secondary | ICD-10-CM | POA: Diagnosis not present

## 2022-08-07 NOTE — Therapy (Signed)
OUTPATIENT PHYSICAL THERAPY ONCOLOGY TREATMENT  Patient Name: Cory Maddox MRN: [id number] DOB:July 16, 1946, 76 y.o., male Today's Date: 08/07/2022   PT End of Session - 08/07/22 1407     Visit Number 4    Number of Visits 16    Date for PT Re-Evaluation 09/18/22    PT Start Time 9983    PT Stop Time 1501    PT Time Calculation (min) 58 min    Activity Tolerance Patient tolerated treatment well    Behavior During Therapy [facility] for tasks assessed/performed             Past Medical History:  Diagnosis Date   Arthritis    Diabetes mellitus without complication ([place])    GERD (gastroesophageal reflux disease)    History of shingles    Hypertension    Sarcoma ([place]) 08/15/2021   Past Surgical History:  Procedure Laterality Date   MASS EXCISION Right 07/27/2021   Procedure: RADICAL EXCISION OF MALIGNANT MASS RIGHT BACK;  Surgeon: Stark Klein, MD;  Location: [place];  Service: General;  Laterality: Right;   Patient Active Problem List   Diagnosis Date Noted   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   Hypertension    History of shingles    Diabetes mellitus without complication ([place])    Arthritis     PCP: Shirline Frees, MD  REFERRING PROVIDER: Stark Klein MD  REFERRING DIAG: s/p Excision Right Shoulder/Back Sarcoma with radiation  THERAPY DIAG:  Aftercare following surgery for neoplasm  Stiffness of right shoulder, not elsewhere classified  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 I can even sit back in a chair now without discomfort at my  scapula.    PERTINENT HISTORY:  Pt underwent resection  of a right shoulder/back sarcoma on 07/27/21. This was a high grade undifferentiated pleomorphic sarcoma. Margins were negative but close. He had minimal pain. He developed lower extremity edema and went to the ED. He was found to have a left tibial vein clot and was placed on eliquis. The tumor was high grade and he underwent radiation finishing on 11/04/2021 He has also had several months since May 6 of significant right upper quadrant pain and decreased  right shoulder ROM. He had multiple scans that did not show tumor reoccurence.   PAIN:  Are you having pain? Not currently,    PRECAUTIONS: DM on insulin, multiple subacute fx deformities at  T4-5 and T6 spinous processes, OA in hands  WEIGHT BEARING RESTRICTIONS: No  FALLS:  Has patient fallen in last 6 months? 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[employer]  LEISURE: golf, outdoor activities, yard work, walking, gym 3 days per week  HAND DOMINANCE: right   PRIOR LEVEL OF FUNCTION: Independent  PATIENT GOALS: Improve ROM of right shoulder   OBJECTIVE:  COGNITION: Overall cognitive status: Within functional limits for tasks assessed   PALPATION: Tender along medial border of right scapula over incisional area and slightly lateral to incision, decreased scar and scapular mobility  OBSERVATIONS / OTHER ASSESSMENTS: 16 cm incison healed from upper scapula along medial scapular border to inferior scapula  SENSATION: Light touch: Deficits   POSTURE: forward head, rounded shoulders  UPPER EXTREMITY AROM/PROM:  A/PROM RIGHT   eval  RIGHT 08/02/22  Shoulder extension 36 44  Shoulder flexion 108 scapular compensation  132  Shoulder abduction 120 with compensation 141   Shoulder internal rotation 42 25  Shoulder external rotation 63 70    (Blank rows = not tested)  A/PROM LEFT   eval  Shoulder extension 52  Shoulder flexion 140  Shoulder abduction 132  Shoulder internal rotation 50  Shoulder external rotation 71    (Blank rows = not tested)  CERVICAL AROM: All within functional limits:      UPPER EXTREMITY STRENGTH: WNL but noted right UT compensation with overhead activities  LYMPHEDEMA ASSESSMENTS:   SURGERY TYPE/DATE: 07/27/2021  NUMBER OF LYMPH NODES REMOVED: 0  CHEMOTHERAPY: no  RADIATION:Yes ended 10/2021  HORMONE TREATMENT: NO  INFECTIONS: NO     QUICK DASH SURVEY: 20%   TODAY'S TREATMENT:     11/20-23: Therapeutic Exercises Pulleys into flexion and abduction x2 mins each returning therapist demo and tactile cues to decrease Rt scapular compensation Ball roll up wall into Rt abduction x10 returning therapist demo  Forearm walking up wall x5, then same position for scapular retraction x5 each and returning therapist demo Manual Therapy STM: In left S/L Scar massage at right scapular incision and lats. Scapular mobs protraction and retraction in Lt S/L Suction cupping to R scapular area and around scar with greatly improved skin mobility noted afterwards PROM in supine in direction of right shoulder flexion, scaption, abd, ER, IR and D2 with scapular depression throughout by therapist due to muscle tightness  08/02/22 In left SL;Scar massage at right scapular incision, Soft tissue mobilization to same and to lats. Scapular mobs elevation/depression, protraction retraction Suction cupping to R scapular area and around scar with greatly improved skin mobility noted afterwards PROM in supine in direction of right shoulder flexion, scaption, abd, ER and IR  07/28/2022  In left SL;Scar massage at right  scapular incision, Soft tissue mobilization to same and to lats. Scapular mobs elevation/depression, protraction retraction Supine wand flexion and scaption x 3-5 reps PROM right shoulder flexion, scaption, abd, ER and IR Standing IR stretch with towelx5, instructed single arm pec wall stretch in doorway   PATIENT EDUCATION:  Education details: Access Code: [email or web address] URL: [email or web address] Date: 07/24/2022 Prepared by: Cheral Almas  Exercises - Supine Shoulder Flexion with Dowel  - 2 x daily - 7 x weekly - 1 sets - 5 reps - 10-15 hold - Standing Shoulder Extension with Dowel  - 2 x daily - 7 x weekly - 1 sets - 5 reps - 10-15 hold - Seated Scapular Retraction  - 2 x daily - 7 x weekly - 1 sets - 10 reps - 3 hold - Single Arm Shoulder Post Capsule Stretch  - 2 x daily - 7 x weekly - 1 sets - 3 reps - 15 hold - Standing Shoulder Internal Rotation Stretch with Hands Behind Back  - 2 x daily - 7 x weekly - 1 sets - 5 reps - 10-15 hold Person educated: Patient Education method: Explanation, Demonstration, and Handouts Education comprehension: verbalized understanding and returned demonstration  HOME EXERCISE PROGRAM:  Supine Shoulder Flexion with Dowel  - 2 x daily - 7 x weekly - 1 sets - 5 reps - 10-15 hold - Standing Shoulder Extension with Dowel  - 2 x daily - 7 x weekly - 1 sets - 5 reps - 10-15 hold - Seated Scapular Retraction  - 2 x daily - 7 x weekly - 1 sets - 10 reps - 3 hold - Single Arm Shoulder Post Capsule Stretch  - 2 x daily - 7 x weekly - 1 sets - 3 reps - 15 hold - Standing Shoulder Internal Rotation Stretch with Hands Behind Back  - 2 x daily - 7 x weekly - 1 sets - 5 reps - 10-15 hold   ASSESSMENT:  CLINICAL IMPRESSION: Progressed pt to include scapula stability exercises which he tolerated well and reported feeling challenged by these. Continued manual therapy including with use of suction cups (2 larger sizes) over Rt scapular area and incision. Pt  reports great progress noted over past 2 sessions.   OBJECTIVE IMPAIRMENTS: decreased knowledge of condition, decreased ROM, decreased strength, impaired flexibility, impaired sensation, impaired UE functional use, and pain.   ACTIVITY LIMITATIONS: reach over head  PARTICIPATION LIMITATIONS:  golf  PERSONAL FACTORS: 1-2 comorbidities: Right upper back/shoulder sarcoma s/p radiation, DM  are also affecting patient's functional outcome.   REHAB POTENTIAL: Good  CLINICAL DECISION MAKING: Stable/uncomplicated  EVALUATION COMPLEXITY: Low  GOALS: Goals reviewed with patient? Yes  SHORT TERM GOALS: Target date: 09/04/2022    Pt will be independent in a HEP to improve shoulder ROM and strength Baseline: Goal status: INITIAL  2.  Pt will improve right shoulder AROM flexion and abd by 10-15 degrees Baseline: flex 108, abd 120 Goal status: INITIAL  3.  Pt will have right shoulder extension increased to 46 degrees Baseline: 36 Goal status: INITIAL  4  Pt will be able to put a belt in normally  Baseline:unable  Goal Status:initial     LONG TERM GOALS: Target date: 10/02/2022    Pt will be able to put his hand/wallet in his right back pocket Baseline: unable Goal status: INITIAL  2.  Pt. Will note improved back swing with golf by atleast 50% Baseline:  Goal status: INITIAL  3.  Right shoulder flexion and abduction will improve to 130 degrees for improved reaching ability Baseline: flex 108, abd 120 Goal status: INITIAL  4.  Quick dash will be improved to no greater than 1% Baseline:  Goal status: INITIAL 5. Pt will have decreased UT/scapular compensation for improved overhead reaching PLAN:  Baseline:    Goal;INITAL  PT FREQUENCY: 2x/week  PT DURATION: 8 weeks  PLANNED INTERVENTIONS: Therapeutic exercises, Therapeutic activity, Neuromuscular re-education, Patient/Family education, Self Care, Joint mobilization, Dry Needling, Spinal mobilization, scar mobilization,  and Manual therapy  PLAN FOR NEXT SESSION: Cont AA/ROM and scapular stability as done today, add yellow theraband for scap retraction with forearms on wall; cont suction cupping to R scapular area, scar mobilization right scapular area, scapular and joint mobs, progress HEP prn, STM prn around scapular/lat area,progress to scapular stability,   Otelia Limes, PTA 08/07/2022, 3:08 PM

## 2022-08-14 ENCOUNTER — Ambulatory Visit: Payer: Medicare Other | Admitting: Rehabilitation

## 2022-08-14 ENCOUNTER — Encounter: Payer: Self-pay | Admitting: Rehabilitation

## 2022-08-14 DIAGNOSIS — M25611 Stiffness of right shoulder, not elsewhere classified: Secondary | ICD-10-CM

## 2022-08-14 DIAGNOSIS — C768 Malignant neoplasm of other specified ill-defined sites: Secondary | ICD-10-CM | POA: Diagnosis not present

## 2022-08-14 DIAGNOSIS — Z483 Aftercare following surgery for neoplasm: Secondary | ICD-10-CM

## 2022-08-14 DIAGNOSIS — C496 Malignant neoplasm of connective and soft tissue of trunk, unspecified: Secondary | ICD-10-CM | POA: Diagnosis not present

## 2022-08-14 DIAGNOSIS — Z923 Personal history of irradiation: Secondary | ICD-10-CM | POA: Diagnosis not present

## 2022-08-14 NOTE — Therapy (Signed)
OUTPATIENT PHYSICAL THERAPY ONCOLOGY TREATMENT  Patient Name: Cory Maddox MRN: [id number] DOB:1946-06-07, 76 y.o., male Today's Date: 08/14/2022   PT End of Session - 08/14/22 1004     Visit Number 5    Number of Visits 16    Date for PT Re-Evaluation 09/18/22    PT Start Time 1003    PT Stop Time 1051    PT Time Calculation (min) 48 min    Activity Tolerance Patient tolerated treatment well    Behavior During Therapy [facility] for tasks assessed/performed              Past Medical History:  Diagnosis Date   Arthritis    Diabetes mellitus without complication ([place])    GERD (gastroesophageal reflux disease)    History of shingles    Hypertension    Sarcoma ([place]) 08/15/2021   Past Surgical History:  Procedure Laterality Date   MASS EXCISION Right 07/27/2021   Procedure: RADICAL EXCISION OF MALIGNANT MASS RIGHT BACK;  Surgeon: Stark Klein, MD;  Location: [facility];  Service: General;  Laterality: Right;   Patient Active Problem List   Diagnosis Date Noted   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   Hypertension    History of shingles    Diabetes mellitus without complication ([place])    Arthritis     PCP: Shirline Frees, MD  REFERRING PROVIDER: Stark Klein MD  REFERRING DIAG: s/p Excision Right Shoulder/Back Sarcoma with radiation  THERAPY DIAG:  Aftercare following surgery for neoplasm  Stiffness of right shoulder, not elsewhere classified  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   PERTINENT HISTORY:  Pt underwent resection  of a right shoulder/back sarcoma on 07/27/21. This was a high grade undifferentiated pleomorphic  sarcoma. Margins were negative but close. He had minimal pain. He developed lower extremity edema and went to the ED. He was found to have a left tibial vein clot and was placed on eliquis. The tumor was high grade and he underwent radiation finishing on 11/04/2021 He has also had several months since May 6 of significant right upper quadrant pain and decreased right shoulder ROM. He had multiple scans that did not show tumor reoccurence.   PAIN:  PAIN:  Are you having pain? Yes NPRS scale: 1/10    PRECAUTIONS:  DM on insulin, multiple subacute fx deformities at  T4-5 and T6 spinous processes, OA in hands  WEIGHT BEARING RESTRICTIONS: No  FALLS:  Has patient fallen in last 6 months? 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[employer]  LEISURE: golf, outdoor activities, yard work, walking, gym 3 days per week  HAND DOMINANCE: right   PRIOR LEVEL OF FUNCTION: Independent  PATIENT GOALS: Improve ROM of right shoulder   OBJECTIVE:  COGNITION: Overall cognitive status: Within functional limits for tasks assessed   PALPATION: Tender along medial border of right scapula over incisional area and slightly lateral to incision, decreased scar and scapular mobility  OBSERVATIONS / OTHER ASSESSMENTS: 16 cm incison healed from upper scapula along medial scapular border to inferior scapula  SENSATION: Light touch: Deficits   POSTURE: forward head, rounded shoulders  UPPER EXTREMITY AROM/PROM:  A/PROM RIGHT   eval  RIGHT 08/02/22 08/14/22  Shoulder extension 36 44 45  Shoulder flexion 108 scapular compensation  132 140  Shoulder abduction 120 with compensation 141 141  Shoulder internal rotation 42 25 43  Shoulder external rotation 63 70 68    (Blank rows = not tested)  A/PROM LEFT    eval  Shoulder extension 52  Shoulder flexion 140  Shoulder abduction 132  Shoulder internal rotation 50  Shoulder external rotation 71    (Blank rows = not tested)  CERVICAL AROM: All within functional limits:      UPPER EXTREMITY STRENGTH: WNL but noted right UT compensation with overhead activities  LYMPHEDEMA ASSESSMENTS:   SURGERY TYPE/DATE: 07/27/2021  NUMBER OF LYMPH NODES REMOVED: 0  CHEMOTHERAPY: no  RADIATION:Yes ended 10/2021  HORMONE TREATMENT: NO  INFECTIONS: NO     QUICK DASH SURVEY: 20%   TODAY'S TREATMENT:     08-14-22: Therapeutic Exercises Pulleys into flexion and abduction x2 mins each returning therapist demo and tactile cues to decrease Rt scapular compensation Ball roll up wall into Rt abduction x10 returning therapist demo  Forearm walking up wall x5, then same position for scapular retraction x5 each and returning therapist demo Manual Therapy STM: In left S/L Scar massage at right scapular incision and lats. Scapular mobs protraction and retraction in Lt S/L Suction cupping to R scapular area and around scar with greatly improved skin mobility noted afterwards PROM in supine in direction of right shoulder flexion, scaption, abd, ER, IR and D2 with scapular depression throughout by therapist due to muscle tightness 11/20-23: Therapeutic Exercises Pulleys into flexion and abduction x2 mins each returning therapist demo and tactile cues to decrease Rt scapular compensation Ball roll up wall into Rt abduction x10 returning therapist demo  Forearm walking up wall x5, then same position for scapular retraction x5 each and returning therapist demo Manual Therapy STM: In left S/L Scar massage at right scapular incision and lats. Scapular mobs protraction and retraction in Lt S/L Suction cupping to R scapular area and around scar with greatly improved skin mobility noted afterwards PROM in supine in direction of right shoulder flexion, scaption,  abd, ER, IR and D2 with scapular depression throughout by therapist due to muscle tightness  08/02/22 In left SL;Scar massage at right scapular incision, Soft tissue mobilization to same and to lats. Scapular mobs elevation/depression, protraction retraction Suction cupping to R scapular area and around scar  with greatly improved skin mobility noted afterwards PROM in supine in direction of right shoulder flexion, scaption, abd, ER and IR  07/28/2022                                                                                                                       In left SL;Scar massage at right scapular incision, Soft tissue mobilization to same and to lats. Scapular mobs elevation/depression, protraction retraction Supine wand flexion and scaption x 3-5 reps PROM right shoulder flexion, scaption, abd, ER and IR Standing IR stretch with towelx5, instructed single arm pec wall stretch in doorway   PATIENT EDUCATION:  Education details: Access Code: [email or web address] URL: [email or web address] Date: 07/24/2022 Prepared by: Cheral Almas  Exercises - Supine Shoulder Flexion with Dowel  - 2 x daily - 7 x weekly - 1 sets - 5 reps - 10-15 hold - Standing Shoulder Extension with Dowel  - 2 x daily - 7 x weekly - 1 sets - 5 reps - 10-15 hold - Seated Scapular Retraction  - 2 x daily - 7 x weekly - 1 sets - 10 reps - 3 hold - Single Arm Shoulder Post Capsule Stretch  - 2 x daily - 7 x weekly - 1 sets - 3 reps - 15 hold - Standing Shoulder Internal Rotation Stretch with Hands Behind Back  - 2 x daily - 7 x weekly - 1 sets - 5 reps - 10-15 hold Person educated: Patient Education method: Explanation, Demonstration, and Handouts Education comprehension: verbalized understanding and returned demonstration  HOME EXERCISE PROGRAM:  Supine Shoulder Flexion with Dowel  - 2 x daily - 7 x weekly - 1 sets - 5 reps - 10-15 hold - Standing Shoulder Extension with Dowel  - 2 x daily - 7 x weekly -  1 sets - 5 reps - 10-15 hold - Seated Scapular Retraction  - 2 x daily - 7 x weekly - 1 sets - 10 reps - 3 hold - Single Arm Shoulder Post Capsule Stretch  - 2 x daily - 7 x weekly - 1 sets - 3 reps - 15 hold - Standing Shoulder Internal Rotation Stretch with Hands Behind Back  - 2 x daily - 7 x weekly - 1 sets - 5 reps - 10-15 hold   ASSESSMENT:  CLINICAL IMPRESSION: Progressed pt to include scapula stability exercises which he tolerated well and reported feeling challenged by these. Continued manual therapy including with use of suction cups ( smaller size)  over Rt scapular area and incision.  Patient has made ROM improvements with shoulder flexion. Pt reports great progress noted over past sessions.  OBJECTIVE IMPAIRMENTS: decreased knowledge of condition, decreased ROM, decreased strength, impaired flexibility, impaired sensation, impaired UE functional use, and pain.   ACTIVITY LIMITATIONS: reach over head  PARTICIPATION LIMITATIONS:  golf  PERSONAL FACTORS: 1-2 comorbidities: Right upper back/shoulder sarcoma s/p radiation, DM  are also affecting patient's functional outcome.   REHAB POTENTIAL: Good  CLINICAL DECISION MAKING: Stable/uncomplicated  EVALUATION COMPLEXITY: Low  GOALS: Goals reviewed with patient? Yes  SHORT TERM GOALS: Target date: 09/11/2022    Pt will be independent in a HEP to improve shoulder ROM and strength Baseline: Goal status: INITIAL  2.  Pt will improve right shoulder AROM flexion and abd by 10-15 degrees Baseline: flex 108, abd 120 Goal status: INITIAL  3.  Pt will have right shoulder extension increased to 46 degrees Baseline: 36 Goal status: INITIAL  4  Pt will be able to put a belt in normally  Baseline:unable  Goal Status:initial     LONG TERM GOALS: Target date: 10/09/2022    Pt will be able to put his hand/wallet in his right back pocket Baseline: unable Goal status: INITIAL  2.  Pt. Will note improved back swing with golf  by atleast 50% Baseline:  Goal status: INITIAL  3.  Right shoulder flexion and abduction will improve to 130 degrees for improved reaching ability Baseline: flex 108, abd 120 Goal status: INITIAL  4.  Quick dash will be improved to no greater than 1% Baseline:  Goal status: INITIAL 5. Pt will have decreased UT/scapular compensation for improved overhead reaching PLAN:  Baseline:    Goal;INITAL  PT FREQUENCY: 2x/week  PT DURATION: 8 weeks  PLANNED INTERVENTIONS: Therapeutic exercises, Therapeutic activity, Neuromuscular re-education, Patient/Family education, Self Care, Joint mobilization, Dry Needling, Spinal mobilization, scar mobilization, and Manual therapy  PLAN FOR NEXT SESSION: Cont AA/ROM and scapular stability as done today, add yellow theraband for scap retraction with forearms on wall; cont suction cupping to R scapular area, scar mobilization right scapular area, scapular and joint mobs, progress HEP prn, STM prn around scapular/lat area,progress to scapular stability,    Jaleena Viviani, Student-PT 08/14/2022  10:59 AM

## 2022-08-18 ENCOUNTER — Encounter: Payer: Self-pay | Admitting: Rehabilitation

## 2022-08-18 ENCOUNTER — Ambulatory Visit: Payer: Medicare Other | Attending: General Surgery | Admitting: Rehabilitation

## 2022-08-18 DIAGNOSIS — Z483 Aftercare following surgery for neoplasm: Secondary | ICD-10-CM | POA: Insufficient documentation

## 2022-08-18 DIAGNOSIS — M25611 Stiffness of right shoulder, not elsewhere classified: Secondary | ICD-10-CM | POA: Insufficient documentation

## 2022-08-18 DIAGNOSIS — C496 Malignant neoplasm of connective and soft tissue of trunk, unspecified: Secondary | ICD-10-CM | POA: Insufficient documentation

## 2022-08-18 NOTE — Therapy (Signed)
OUTPATIENT PHYSICAL THERAPY ONCOLOGY TREATMENT  Patient Name: Cory Maddox MRN: [id number] DOB:1946/05/21, 76 y.o., male Today's Date: 08/18/2022   Past Medical History:  Diagnosis Date   Arthritis    Diabetes mellitus without complication ([place])    GERD (gastroesophageal reflux disease)    History of shingles    Hypertension    Sarcoma ([place]) 08/15/2021   Past Surgical History:  Procedure Laterality Date   MASS EXCISION Right 07/27/2021   Procedure: RADICAL EXCISION OF MALIGNANT MASS RIGHT BACK;  Surgeon: Stark Klein, MD;  Location: [facility];  Service: General;  Laterality: Right;   Patient Active Problem List   Diagnosis Date Noted   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   Hypertension    History of shingles    Diabetes mellitus without complication ([place])    Arthritis     PCP: Shirline Frees, MD REFERRING PROVIDER: Stark Klein MD REFERRING DIAG: s/p Excision Right Shoulder/Back Sarcoma with radiation THERAPY DIAG:  Aftercare following surgery for neoplasm  Stiffness of right shoulder, not elsewhere classified  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  I got 4 hours of sleep yesterday.  The positive thing is that the shoulder has minimal discomfort.  I played pretty good golf yesterday.    PERTINENT HISTORY:  Pt underwent resection  of a right shoulder/back sarcoma on 07/27/21. This was a high grade undifferentiated pleomorphic sarcoma. Margins were negative but close. He had minimal pain. He developed lower extremity edema and went to the ED. He was found to have a left tibial vein clot and was placed on eliquis. The tumor was high grade and he  underwent radiation finishing on 11/04/2021 He has also had several months since May 6 of significant right upper quadrant pain and decreased right shoulder ROM. He had multiple scans that did not show tumor reoccurence.   PAIN:  PAIN:  Are you having pain? Yes NPRS scale: 1/10  PRECAUTIONS: DM on insulin, multiple subacute fx deformities at  T4-5 and T6 spinous processes, OA in hands  WEIGHT BEARING RESTRICTIONS: No  FALLS:  Has patient fallen in last 6 months? 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[employer]  LEISURE: golf, outdoor activities, yard work, walking, gym 3 days per week  HAND DOMINANCE: right   PRIOR LEVEL OF FUNCTION: Independent  PATIENT GOALS: Improve ROM of right shoulder   OBJECTIVE:  COGNITION: Overall cognitive status: Within functional limits for tasks assessed   PALPATION: Tender along medial border of right scapula over incisional area and slightly lateral to incision, decreased scar and scapular mobility  OBSERVATIONS / OTHER ASSESSMENTS: 16 cm incison healed from upper scapula along medial scapular border to inferior scapula  SENSATION: Light touch: Deficits   POSTURE: forward head, rounded shoulders  UPPER EXTREMITY AROM/PROM:  A/PROM RIGHT   eval  RIGHT 08/02/22 08/14/22  Shoulder extension 36 44 45  Shoulder flexion 108 scapular compensation  132 140  Shoulder abduction 120 with compensation 141 141  Shoulder internal rotation 42 25 43  Shoulder external rotation 63 70 68    (Blank rows = not tested)  A/PROM LEFT   eval  Shoulder extension 52  Shoulder flexion 140  Shoulder abduction 132  Shoulder internal rotation 50  Shoulder external rotation 71    (Blank rows = not tested)  UPPER EXTREMITY STRENGTH: WNL but noted right UT  compensation with overhead activities  LYMPHEDEMA ASSESSMENTS:   SURGERY TYPE/DATE: 07/27/2021 NUMBER OF LYMPH NODES REMOVED: 0 CHEMOTHERAPY: no RADIATION:Yes ended 10/2021 HORMONE TREATMENT: NO INFECTIONS: NO  QUICK DASH SURVEY: 20% TODAY'S TREATMENT:     08-18-22: Therapeutic Exercises: held on gym due to pt request due to feeling stretched out after MT Manual Therapy STM: In left S/L Scar massage at right scapular incision and lats. Scapular mobs protraction and retraction in Lt S/L PROM in supine in direction of right shoulder flexion, scaption, abd, ER, IR and D2 with scapular depression throughout by therapist due to muscle tightness MWM into sidelying abduction and ER with Manley stabilization and scapular assist  08-14-22: Therapeutic Exercises Pulleys into flexion and abduction x2 mins each returning therapist demo and tactile cues to decrease Rt scapular compensation Ball roll up wall into Rt abduction x10 returning therapist demo  Forearm walking up wall x5, then same position for scapular retraction x5 each and returning therapist demo Manual Therapy STM: In left S/L Scar massage at right scapular incision and lats. Scapular mobs protraction and retraction in Lt S/L Suction cupping to R scapular area and inferior to scar with greatly improved skin mobility noted afterwards PROM in supine in direction of right shoulder flexion, scaption, abd, ER, IR and D2 with scapular depression throughout by therapist due to muscle tightness  11/20-23: Therapeutic Exercises Pulleys into flexion and abduction x2 mins each returning therapist demo and tactile cues to decrease Rt scapular compensation Ball roll up wall into Rt abduction x10 returning therapist demo  Forearm walking up wall x5, then same position for scapular retraction x5 each and returning therapist demo Manual Therapy STM: In left S/L Scar massage at right scapular incision and lats. Scapular mobs protraction and  retraction in Lt S/L Suction cupping to R scapular area and around scar with greatly improved skin mobility noted afterwards PROM in supine in direction of right shoulder flexion, scaption, abd, ER, IR and D2 with scapular depression throughout by therapist due to muscle tightness  08/02/22 In left SL;Scar massage at right scapular incision, Soft tissue mobilization to same and to lats. Scapular mobs elevation/depression, protraction retraction Suction cupping to R scapular area and around scar with greatly improved skin mobility noted afterwards PROM in  supine in direction of right shoulder flexion, scaption, abd, ER and IR  07/28/2022                                                                                                                       In left SL;Scar massage at right scapular incision, Soft tissue mobilization to same and to lats. Scapular mobs elevation/depression, protraction retraction Supine wand flexion and scaption x 3-5 reps PROM right shoulder flexion, scaption, abd, ER and IR Standing IR stretch with towelx5, instructed single arm pec wall stretch in doorway   PATIENT EDUCATION:  Education details: Access Code: [email or web address] URL: [email or web address] Date: 07/24/2022 Prepared by: Cheral Almas  Exercises - Supine Shoulder Flexion with Dowel  - 2 x daily - 7 x weekly - 1 sets - 5 reps - 10-15 hold - Standing Shoulder Extension with Dowel  - 2 x daily - 7 x weekly - 1 sets - 5 reps - 10-15 hold - Seated Scapular Retraction  - 2 x daily - 7 x weekly - 1 sets - 10 reps - 3 hold - Single Arm Shoulder Post Capsule Stretch  - 2 x daily - 7 x weekly - 1 sets - 3 reps - 15 hold - Standing Shoulder Internal Rotation Stretch with Hands Behind Back  - 2 x daily - 7 x weekly - 1 sets - 5 reps - 10-15 hold Person educated: Patient Education method: Explanation, Demonstration, and Handouts Education comprehension: verbalized understanding and returned  demonstration  HOME EXERCISE PROGRAM:  Supine Shoulder Flexion with Dowel  - 2 x daily - 7 x weekly - 1 sets - 5 reps - 10-15 hold - Standing Shoulder Extension with Dowel  - 2 x daily - 7 x weekly - 1 sets - 5 reps - 10-15 hold - Seated Scapular Retraction  - 2 x daily - 7 x weekly - 1 sets - 10 reps - 3 hold - Single Arm Shoulder Post Capsule Stretch  - 2 x daily - 7 x weekly - 1 sets - 3 reps - 15 hold - Standing Shoulder Internal Rotation Stretch with Hands Behind Back  - 2 x daily - 7 x weekly - 1 sets - 5 reps - 10-15 hold   ASSESSMENT:  CLINICAL IMPRESSION: Continued manual therapy over Rt scapular area and incision.  Patient has made ROM improvements with shoulder flexion. Pt reports great progress noted over past sessions. Very tight scapular accessory motions mainly into elevation and depression  OBJECTIVE IMPAIRMENTS: decreased knowledge of condition, decreased ROM, decreased strength, impaired flexibility, impaired sensation, impaired UE functional use, and pain.   ACTIVITY LIMITATIONS: reach over head  PARTICIPATION LIMITATIONS:  golf  PERSONAL FACTORS: 1-2 comorbidities: Right upper back/shoulder sarcoma s/p radiation, DM  are also affecting patient's functional outcome.   REHAB POTENTIAL: Good  CLINICAL DECISION MAKING: Stable/uncomplicated  EVALUATION COMPLEXITY: Low  GOALS: Goals reviewed with patient? Yes  SHORT TERM GOALS: Target date: 09/15/2022    Pt will be independent in  a HEP to improve shoulder ROM and strength Baseline: Goal status: INITIAL  2.  Pt will improve right shoulder AROM flexion and abd by 10-15 degrees Baseline: flex 108, abd 120 Goal status: INITIAL  3.  Pt will have right shoulder extension increased to 46 degrees Baseline: 36 Goal status: INITIAL  4  Pt will be able to put a belt in normally  Baseline:unable  Goal Status:initial     LONG TERM GOALS: Target date: 10/13/2022    Pt will be able to put his hand/wallet in his  right back pocket Baseline: unable Goal status: INITIAL  2.  Pt. Will note improved back swing with golf by atleast 50% Baseline:  Goal status: INITIAL  3.  Right shoulder flexion and abduction will improve to 130 degrees for improved reaching ability Baseline: flex 108, abd 120 Goal status: INITIAL  4.  Quick dash will be improved to no greater than 1% Baseline:  Goal status: INITIAL 5. Pt will have decreased UT/scapular compensation for improved overhead reaching PLAN:  Baseline:    Goal;INITAL  PT FREQUENCY: 2x/week  PT DURATION: 8 weeks  PLANNED INTERVENTIONS: Therapeutic exercises, Therapeutic activity, Neuromuscular re-education, Patient/Family education, Self Care, Joint mobilization, Dry Needling, Spinal mobilization, scar mobilization, and Manual therapy  PLAN FOR NEXT SESSION: Cont AA/ROM and scapular stability, add yellow theraband; cont suction cupping to R scapular area avoiding seroma location, scar mobilization right scapular area, scapular and joint mobs, progress HEP prn, STM prn around scapular/lat area,progress to scapular stability,    Shan Levans, PT  08/18/2022  10:47 AM

## 2022-08-21 ENCOUNTER — Ambulatory Visit
Admission: RE | Admit: 2022-08-21 | Discharge: 2022-08-21 | Disposition: A | Discharge status: Home / Self Care | Payer: Medicare Other | Source: Ambulatory Visit | Attending: General Surgery | Admitting: General Surgery

## 2022-08-21 ENCOUNTER — Other Ambulatory Visit: Payer: Self-pay | Admitting: General Surgery

## 2022-08-21 DIAGNOSIS — R600 Localized edema: Secondary | ICD-10-CM | POA: Diagnosis not present

## 2022-08-21 DIAGNOSIS — R918 Other nonspecific abnormal finding of lung field: Secondary | ICD-10-CM | POA: Diagnosis not present

## 2022-08-21 DIAGNOSIS — C493 Malignant neoplasm of connective and soft tissue of thorax: Secondary | ICD-10-CM | POA: Diagnosis not present

## 2022-08-21 DIAGNOSIS — C496 Malignant neoplasm of connective and soft tissue of trunk, unspecified: Secondary | ICD-10-CM

## 2022-08-21 DIAGNOSIS — J90 Pleural effusion, not elsewhere classified: Secondary | ICD-10-CM | POA: Diagnosis not present

## 2022-08-25 ENCOUNTER — Ambulatory Visit: Payer: Medicare Other

## 2022-08-28 ENCOUNTER — Ambulatory Visit: Payer: Medicare Other

## 2022-08-28 DIAGNOSIS — Z483 Aftercare following surgery for neoplasm: Secondary | ICD-10-CM | POA: Diagnosis not present

## 2022-08-28 DIAGNOSIS — C496 Malignant neoplasm of connective and soft tissue of trunk, unspecified: Secondary | ICD-10-CM

## 2022-08-28 DIAGNOSIS — M25611 Stiffness of right shoulder, not elsewhere classified: Secondary | ICD-10-CM | POA: Diagnosis not present

## 2022-08-28 NOTE — Therapy (Signed)
OUTPATIENT PHYSICAL THERAPY ONCOLOGY TREATMENT  Patient Name: Cory Maddox MRN: [id number] DOB:1946/09/13, 76 y.o., male Today's Date: 08/28/2022   PT End of Session - 08/28/22 1000     Visit Number 7    Number of Visits 16    Date for PT Re-Evaluation 09/18/22    PT Start Time 6387    PT Stop Time 5643    PT Time Calculation (min) 51 min    Activity Tolerance Patient tolerated treatment well    Behavior During Therapy [facility] for tasks assessed/performed               Past Medical History:  Diagnosis Date   Arthritis    Diabetes mellitus without complication ([place])    GERD (gastroesophageal reflux disease)    History of shingles    Hypertension    Sarcoma ([place]) 08/15/2021   Past Surgical History:  Procedure Laterality Date   MASS EXCISION Right 07/27/2021   Procedure: RADICAL EXCISION OF MALIGNANT MASS RIGHT BACK;  Surgeon: Stark Klein, MD;  Location: [facility];  Service: General;  Laterality: Right;   Patient Active Problem List   Diagnosis Date Noted   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   Hypertension    History of shingles    Diabetes mellitus without complication ([place])    Arthritis     PCP: Shirline Frees, MD REFERRING PROVIDER: Stark Klein MD REFERRING DIAG: s/p Excision Right Shoulder/Back Sarcoma with radiation THERAPY DIAG:  Aftercare following surgery for neoplasm  Stiffness of right shoulder, not elsewhere classified  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 I don't have that extreme pain. It is a 4/10 at worst now. Pain is intermittent but about 75% better.  I can play 9 holes of golf but my swing shortens if I play the  next 9.  I am keeping up with the exercises. I don't think I will get the ROM all the way back so I will have to work on another swing.    PERTINENT HISTORY:  Pt underwent resection  of a right shoulder/back sarcoma on 07/27/21. This was a high grade undifferentiated pleomorphic sarcoma. Margins were negative but close. He had minimal pain. He developed lower extremity edema and went to the ED. He was found to have a left tibial vein clot and was placed on  eliquis. The tumor was high grade and he underwent radiation finishing on 11/04/2021 He has also had several months since May 6 of significant right upper quadrant pain and decreased right shoulder ROM. He had multiple scans that did not show tumor reoccurence.   PAIN:  PAIN:  Are you having pain? 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[employer]  LEISURE: golf, outdoor activities, yard work, walking, gym 3 days per week  HAND DOMINANCE: right   PRIOR LEVEL OF FUNCTION: Independent  PATIENT GOALS: Improve ROM of right shoulder   OBJECTIVE:  COGNITION: Overall cognitive status: Within functional limits for tasks assessed   PALPATION: Tender along medial border of right scapula over incisional area and slightly lateral to incision, decreased scar and scapular mobility  OBSERVATIONS / OTHER ASSESSMENTS: 16 cm incison healed from upper scapula along medial scapular border to inferior scapula  SENSATION: Light touch: Deficits   POSTURE:  forward head, rounded shoulders  UPPER EXTREMITY AROM/PROM:  A/PROM RIGHT   eval  RIGHT 08/02/22 08/14/22  Shoulder extension 36 44 45  Shoulder flexion 108 scapular compensation  132 140  Shoulder abduction 120 with compensation 141 141  Shoulder internal rotation 42 25 43  Shoulder external rotation 63 70 68    (Blank rows = not tested)  A/PROM LEFT   eval  Shoulder extension 52  Shoulder flexion 140  Shoulder abduction 132  Shoulder internal rotation 50  Shoulder external rotation 71    (Blank rows = not tested)  UPPER EXTREMITY STRENGTH: WNL but noted right UT compensation with overhead activities  LYMPHEDEMA ASSESSMENTS:   SURGERY TYPE/DATE: 07/27/2021 NUMBER OF LYMPH NODES REMOVED: 0 CHEMOTHERAPY: no RADIATION:Yes ended 10/2021 HORMONE TREATMENT: NO INFECTIONS: NO  QUICK DASH SURVEY: 20% TODAY'S TREATMENT:    08/28/2022  STM: In left S/L Scar massage at right scapular incision and lats. Scapular mobs protraction and retraction in Lt S/L Suction cupping to R scapular area and inferior to scar Supine wand flexion and scaption  2 positions x 4 PROM in supine in direction of right shoulder flexion, scaption, abd, ER, IR and D2 Push ups on wall with a plus x10 to isolate scapular muscles 4 D ball rolls on wall x 10    08-18-22: Therapeutic Exercises: held on gym due to pt request due to feeling stretched out after MT Manual Therapy STM: In left S/L Scar massage at right scapular incision and lats. Scapular mobs protraction and retraction in Lt S/L PROM in supine in direction of right shoulder flexion, scaption, abd, ER, IR and D2 with scapular depression throughout by therapist due to muscle tightness MWM into sidelying abduction and ER with [place] stabilization and scapular assist  08-14-22: Therapeutic Exercises Pulleys into flexion and abduction x2 mins each returning therapist demo and tactile cues to decrease Rt scapular compensation Ball roll up wall into Rt  abduction x10 returning therapist demo  Forearm walking up wall x5, then same position for scapular retraction x5 each and returning therapist demo Manual Therapy STM: In left  S/L Scar massage at right scapular incision and lats. Scapular mobs protraction and retraction in Lt S/L Suction cupping to R scapular area and inferior to scar with greatly improved skin mobility noted afterwards PROM in supine in direction of right shoulder flexion, scaption, abd, ER, IR and D2 with scapular depression throughout by therapist due to muscle tightness  11/20-23: Therapeutic Exercises Pulleys into flexion and abduction x2 mins each returning therapist demo and tactile cues to decrease Rt scapular compensation Ball roll up wall into Rt abduction x10 returning therapist demo  Forearm walking up wall x5, then same position for scapular retraction x5 each and returning therapist demo Manual Therapy STM: In left S/L Scar massage at right scapular incision and lats. Scapular mobs protraction and retraction in Lt S/L Suction cupping to R scapular area and around scar with greatly improved skin mobility noted afterwards PROM in supine in direction of right shoulder flexion, scaption, abd, ER, IR and D2 with scapular depression throughout by therapist due to muscle tightness  08/02/22 In left SL;Scar massage at right scapular incision, Soft tissue mobilization to same and to lats. Scapular mobs elevation/depression, protraction retraction Suction cupping to R scapular area and around scar with greatly improved skin mobility noted afterwards PROM in supine in direction of right shoulder flexion, scaption, abd, ER and IR  07/28/2022                                                                                                                       In left SL;Scar massage at right scapular incision, Soft tissue mobilization to same and to lats. Scapular mobs elevation/depression, protraction retraction Supine  wand flexion and scaption x 3-5 reps PROM right shoulder flexion, scaption, abd, ER and IR Standing IR stretch with towelx5, instructed single arm pec wall stretch in doorway   PATIENT EDUCATION:  Education details: Access Code: [email or web address] URL: [email or web address] Date: 07/24/2022 Prepared by: Cheral Almas  Exercises - Supine Shoulder Flexion with Dowel  - 2 x daily - 7 x weekly - 1 sets - 5 reps - 10-15 hold - Standing Shoulder Extension with Dowel  - 2 x daily - 7 x weekly - 1 sets - 5 reps - 10-15 hold - Seated Scapular Retraction  - 2 x daily - 7 x weekly - 1 sets - 10 reps - 3 hold - Single Arm Shoulder Post Capsule Stretch  - 2 x daily - 7 x weekly - 1 sets - 3 reps - 15 hold - Standing Shoulder Internal Rotation Stretch with Hands Behind Back  - 2 x daily - 7 x weekly - 1 sets - 5 reps - 10-15 hold Person educated: Patient Education method: Explanation, Demonstration, and Handouts Education comprehension: verbalized understanding and returned demonstration  HOME EXERCISE PROGRAM:  Supine Shoulder Flexion with Dowel  - 2 x daily - 7 x weekly - 1 sets - 5 reps - 10-15 hold - Standing Shoulder Extension with Dowel  - 2 x daily - 7 x  weekly - 1 sets - 5 reps - 10-15 hold - Seated Scapular Retraction  - 2 x daily - 7 x weekly - 1 sets - 10 reps - 3 hold - Single Arm Shoulder Post Capsule Stretch  - 2 x daily - 7 x weekly - 1 sets - 3 reps - 15 hold - Standing Shoulder Internal Rotation Stretch with Hands Behind Back  - 2 x daily - 7 x weekly - 1 sets - 5 reps - 10-15 hold   ASSESSMENT:  CLINICAL IMPRESSION: Continued manual therapy over Rt scapular area and incision.  Pt notes 75-80 % improvement in pain but is frustrated with continued decrease ROM.  Work activities have been hard on him and he may need to wait until after the holidays to continue. He will let us know this week. He was surprised how fatigued he was with 4 D ball rolls and continues to require VC's  for scapular compensation. n  OBJECTIVE IMPAIRMENTS: decreased knowledge of condition, decreased ROM, decreased strength, impaired flexibility, impaired sensation, impaired UE functional use, and pain.   ACTIVITY LIMITATIONS: reach over head  PARTICIPATION LIMITATIONS:  golf  PERSONAL FACTORS: 1-2 comorbidities: Right upper back/shoulder sarcoma s/p radiation, DM  are also affecting patient's functional outcome.   REHAB POTENTIAL: Good  CLINICAL DECISION MAKING: Stable/uncomplicated  EVALUATION COMPLEXITY: Low  GOALS: Goals reviewed with patient? Yes  SHORT TERM GOALS: Target date: 09/25/2022    Pt will be independent in a HEP to improve shoulder ROM and strength Baseline: Goal status: achieved 2.  Pt will improve right shoulder AROM flexion and abd by 10-15 degrees Baseline: flex 108, abd 120 Goal status: INITIAL  3.  Pt will have right shoulder extension increased to 46 degrees Baseline: 36 Goal status: INITIAL  4  Pt will be able to put a belt in normally  Baseline:unable  Goal Status: Not achieved but improved   LONG TERM GOALS: Target date: 10/23/2022    Pt will be able to put his hand/wallet in his right back pocket Baseline: unable Goal status: INITIAL  2.  Pt. Will note improved back swing with golf by atleast 50% Baseline:  Goal status: INITIAL  3.  Right shoulder flexion and abduction will improve to 130 degrees for improved reaching ability Baseline: flex 108, abd 120 Goal status: INITIAL  4.  Quick dash will be improved to no greater than 1% Baseline:  Goal status: INITIAL 5. Pt will have decreased UT/scapular compensation for improved overhead reaching PLAN:  Baseline:    Goal;INITAL  PT FREQUENCY: 2x/week  PT DURATION: 8 weeks  PLANNED INTERVENTIONS: Therapeutic exercises, Therapeutic activity, Neuromuscular re-education, Patient/Family education, Self Care, Joint mobilization, Dry Needling, Spinal mobilization, scar mobilization, and Manual  therapy  PLAN FOR NEXT SESSION: Cont AA/ROM and scapular stability, add yellow theraband; cont suction cupping to R scapular area avoiding seroma location, scar mobilization right scapular area, scapular and joint mobs, progress HEP prn, STM prn around scapular/lat area,progress to scapular stability,  DN?   Shan Levans, PT  08/28/2022  10:59 AM

## 2022-09-01 ENCOUNTER — Ambulatory Visit: Payer: Medicare Other | Admitting: Rehabilitation

## 2022-09-04 ENCOUNTER — Telehealth: Payer: Self-pay | Admitting: Oncology

## 2022-09-04 NOTE — Telephone Encounter (Signed)
Called patient regarding rescheduled January appointment due to providers departure. I called multiple times a week to notify this patient regarding this appointment. Left a voicemail.

## 2022-09-12 ENCOUNTER — Ambulatory Visit (HOSPITAL_COMMUNITY)
Admission: RE | Admit: 2022-09-12 | Discharge: 2022-09-12 | Disposition: A | Discharge status: Home / Self Care | Payer: Medicare Other | Source: Ambulatory Visit | Attending: Oncology | Admitting: Oncology

## 2022-09-12 DIAGNOSIS — Z9889 Other specified postprocedural states: Secondary | ICD-10-CM | POA: Diagnosis not present

## 2022-09-12 DIAGNOSIS — J929 Pleural plaque without asbestos: Secondary | ICD-10-CM | POA: Diagnosis not present

## 2022-09-12 DIAGNOSIS — C493 Malignant neoplasm of connective and soft tissue of thorax: Secondary | ICD-10-CM | POA: Insufficient documentation

## 2022-09-13 ENCOUNTER — Ambulatory Visit (HOSPITAL_COMMUNITY): Admission: RE | Admit: 2022-09-13 | Payer: Medicare Other | Source: Ambulatory Visit

## 2022-09-29 ENCOUNTER — Encounter: Payer: Medicare Other | Admitting: Internal Medicine

## 2022-10-03 DIAGNOSIS — H43821 Vitreomacular adhesion, right eye: Secondary | ICD-10-CM | POA: Diagnosis not present

## 2022-10-03 DIAGNOSIS — H2511 Age-related nuclear cataract, right eye: Secondary | ICD-10-CM | POA: Diagnosis not present

## 2022-10-03 DIAGNOSIS — E113213 Type 2 diabetes mellitus with mild nonproliferative diabetic retinopathy with macular edema, bilateral: Secondary | ICD-10-CM | POA: Diagnosis not present

## 2022-10-03 DIAGNOSIS — H43391 Other vitreous opacities, right eye: Secondary | ICD-10-CM | POA: Diagnosis not present

## 2022-10-10 ENCOUNTER — Other Ambulatory Visit: Payer: Self-pay

## 2022-10-10 ENCOUNTER — Ambulatory Visit: Payer: Medicare Other | Admitting: Oncology

## 2022-10-10 ENCOUNTER — Inpatient Hospital Stay: Payer: Medicare Other | Attending: Hematology and Oncology | Admitting: Hematology and Oncology

## 2022-10-10 ENCOUNTER — Other Ambulatory Visit: Payer: Self-pay | Admitting: Hematology and Oncology

## 2022-10-10 VITALS — BP 159/80 | HR 92 | Temp 98.0°F | Resp 18 | Ht 67.0 in | Wt 158.6 lb

## 2022-10-10 DIAGNOSIS — Z7982 Long term (current) use of aspirin: Secondary | ICD-10-CM | POA: Diagnosis not present

## 2022-10-10 DIAGNOSIS — Z7984 Long term (current) use of oral hypoglycemic drugs: Secondary | ICD-10-CM | POA: Insufficient documentation

## 2022-10-10 DIAGNOSIS — Z923 Personal history of irradiation: Secondary | ICD-10-CM | POA: Insufficient documentation

## 2022-10-10 DIAGNOSIS — Z794 Long term (current) use of insulin: Secondary | ICD-10-CM | POA: Diagnosis not present

## 2022-10-10 DIAGNOSIS — E119 Type 2 diabetes mellitus without complications: Secondary | ICD-10-CM

## 2022-10-10 DIAGNOSIS — I10 Essential (primary) hypertension: Secondary | ICD-10-CM | POA: Diagnosis not present

## 2022-10-10 DIAGNOSIS — R5381 Other malaise: Secondary | ICD-10-CM

## 2022-10-10 DIAGNOSIS — Z79899 Other long term (current) drug therapy: Secondary | ICD-10-CM | POA: Insufficient documentation

## 2022-10-10 DIAGNOSIS — C493 Malignant neoplasm of connective and soft tissue of thorax: Secondary | ICD-10-CM | POA: Diagnosis not present

## 2022-10-11 ENCOUNTER — Encounter: Payer: Self-pay | Admitting: Hematology and Oncology

## 2022-10-11 NOTE — Assessment & Plan Note (Signed)
Despite elevated hemoglobin A1c, the patient had recurrent episodes of hypoglycemia from insulin therapy He is very active with physical exercise including golfing for many hours weekly We discussed importance of dietary modification and lifestyle changes

## 2022-10-11 NOTE — Assessment & Plan Note (Signed)
The patient has completed adjuvant radiation therapy I have reviewed his recent CT chest and MRI which show no evidence of residual disease We will continue surveillance imaging study again in 6 months

## 2022-10-11 NOTE — Assessment & Plan Note (Signed)
He has developed significant restriction in range of motion of his right upper extremity from surgery and radiation induced fibrosis This is causing interference with activities of daily living including his inability to enjoy golfing Recommend resumption of physical therapy and rehab and he is in agreement I will send a referral

## 2022-10-11 NOTE — Progress Notes (Signed)
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 male was transferred to my care after his prior physician has left.  I reviewed the patient's records extensive and collaborated the history with the patient. 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  He was evaluated by Dr. Redmond Pulling and a biopsy showed a soft tissue tumor that is beyond a lipoma.     07/27/2021 Imaging   Large soft tissue sarcoma along the right posterior chest wall, measuring 11.7 x 3.6 x 9.8 cm, with solid nodular enhancement and central necrosis and hemorrhage. No evidence of intercostal soft tissue invasion or osseous involvement. The mass appears to be both intra and extra muscular   07/27/2021 Pathology Results   A. SOFT TISSUE, RIGHT BACK, RADICAL EXCISION: Undifferentiated pleomorphic sarcoma, high-grade, involving skeletal muscle with clear but close margins of resection and normal overlying skin. Please see the following synoptic report.  SOFT TISSUE:  Resection  Procedure: Wide resection. Tumor Focality: Unifocal.      Number of Tumors: One (1). Tumor Site: Right side of back. Tumor Size: 11.2 cm. x 7.8 cm. x 5.0 cm. Histologic Type (World Health Organization [facility]) classification of soft tissue tumors): Undifferentiated pleomorphic sarcoma Histologic Grade ([place] [FNCLCC]): Grade 3. Mitotic Rate: 8/10 high-power fields (HPF) Necrosis: Present.      Extent of Necrosis: Focal. Treatment Effect: No known presurgical therapy.      Percentage of Viable Tumor: 95% Margins: All  margins negative for tumor.      Distance from Tumor to Closest Margin: 1 mm. (Medial, superior and posterior.)      Other Close Margin(s) to Tumor (<2 cm): 0.2 to 1.2 cm. (Lateral and inferior.)      Margins Involved by Tumor: None Regional Lymph Nodes: Not applicable (no lymph nodes submitted or found).      Number of Lymph Nodes with Tumor: NA.      Number of Lymph Nodes Examined: None (0). Distant Metastasis:      Distant Site(s) Involved: None known. Pathologic Stage Classification (pTNM, AJCC 8th Edition): pT3, pNX Ancillary Studies: Can be performed upon request. Representative Tumor Block: A12. Comment(s): The following immunostains are performed on Block A-6: SMA: Negative. Desmin: Negative. S100: Negative. CD68: Positive and isolated cells. Ki-67: High proliferative index (80 to 85%).  ADDENDUM:  Additional special stains/IHC is performed to exclude the possibility of keratinocytic or vascular lineage. CK AE1/3 (pan-cytokeratin) is negative, militating against an unusual poorly differentiated carcinoma, and CD 31 (vascular/endothelial marker) is also negative, militating against an vascular neoplasm.    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   Mild fatty change of the liver.   Small appendicoliths within the appendix but no sign of appendicitis.   Moderate amount of stool within the left colon.    03/16/2022 Imaging   CT chest 1. There is an area of mixed airspace and ground-glass attenuation extending to the left lung base. This is nonspecific and may represent an area of inflammation or infection differential considerations include: recurrent aspiration as well as hypoventilatory changes secondary to postsurgical change. Correlation for any clinical signs or symptoms of pneumonia recommended. In the absence of resolution consider referral to thoracic surgery for further management. 2. Postsurgical changes in the right posterior chest wall with persistent thin walled fluid collection within the surgical bed. If there are clinical signs or symptoms of infection percutaneous ultrasound-guided needle aspiration may be indicated to exclude underlying infection. 3. Multiple subacute fracture deformities involving the T4, T5 and T6 spinous processes.   06/23/2022 Procedure   He had incisional biopsy, pathology revealed high-grade pleomorphic sarcomatoid mass versus dedifferentiated sarcoma    08/22/2022 Imaging   MRI chest 1. Postsurgical changes to the right posterior chest wall from tumor resection. Well-defined fluid collection at the resection bed measuring approximately 9.6 x 1.7 x 4.0 cm most compatible with a postoperative seroma or liquified hematoma. No evidence of new or recurrent soft tissue mass within the resection bed. 2. Edema within the right posterior paraspinal musculature and adjacent right shoulder girdle musculature, likely reactive. 3. Ill-defined airspace opacity within the posterior aspect of the right lower lobe. Trace right pleural effusion. 4. Scattered subcentimeter T2 hyperintense lesions within the imaged liver are unchanged from the previous CT and most likely represent cysts.        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 but he takes gabapentin for it  MEDICAL HISTORY:  Past Medical History:  Diagnosis Date   Arthritis    Diabetes mellitus without complication ([place])    GERD (gastroesophageal reflux disease)    History of shingles    Hypertension    Sarcoma ([place]) 08/15/2021    SURGICAL HISTORY: Past Surgical History:  Procedure Laterality Date   MASS EXCISION Right 07/27/2021   Procedure: RADICAL EXCISION OF MALIGNANT MASS RIGHT BACK;  Surgeon: Stark Klein, MD;  Location: [facility] OR;  Service: General;  Laterality: Right;    SOCIAL HISTORY: Social History   Socioeconomic History   Marital status: Married    Spouse name: Not on file   Number of children: Not on file   Years of education: Not on file   Highest education level: Not on file  Occupational History   Not on file  Tobacco Use   Smoking status: Never   Smokeless tobacco: Never  Vaping Use   Vaping Use: Never used  Substance and Sexual Activity   Alcohol use: Not Currently    Alcohol/week: 5.0 standard drinks of alcohol    Types: 5 Glasses of wine per week   Drug use: No   Sexual activity: Yes    Comment: married  Other Topics Concern   Not on file  Social History Narrative  Married   Retired from [employer] in 2011   Loves to [place] Strain: Not on [employer] Insecurity: Not on file  Transportation Needs: Not on file  Physical Activity: Not on file  Stress: Not on file  Social Connections: Not on file  Intimate Partner Violence: Not At Risk (09/06/2021)   Humiliation, Afraid, Rape, and Kick questionnaire    Fear of Current or Ex-Partner: No    Emotionally Abused: No    Physically Abused: No    Sexually Abused: No    FAMILY HISTORY: Family History  Problem Relation Age of Onset   Diabetes Mother     ALLERGIES:  is allergic to aciphex [rabeprazole], lisinopril,  pravastatin, and simvastatin.  MEDICATIONS:  Current Outpatient Medications  Medication Sig Dispense Refill   amLODipine (NORVASC) 5 MG tablet Take 5 mg by mouth daily.     aspirin EC 81 MG tablet Take 81 mg by mouth daily. Swallow whole.     B-D UF III MINI PEN NEEDLES 31G X 5 MM MISC USE TO CHECK BLOOD SUGAR BID  3   Blood Glucose Monitoring Suppl (ACCU-CHEK GUIDE) w/Device KIT U UTD TO CHECK BLOOD SUGAR  0   Blood Pressure Monitoring (BLOOD PRESSURE MONITOR AUTOMAT) DEVI TO CHECK BLOOD PRESSURE TWICE DAILY AS DIRECTED 1 Device 0   Continuous Blood Gluc Sensor (FREESTYLE LIBRE 3 SENSOR) MISC USE TO CHECK BLOOD SUGAR AS DIRECTED     gabapentin (NEURONTIN) 100 MG capsule Take 1 capsule (100 mg total) by mouth 2 (two) times daily. 28 capsule 1   glipiZIDE (GLUCOTROL) 5 MG tablet Take 5 mg by mouth daily before breakfast.     glucose blood (ACCU-CHEK GUIDE) test strip Use to check blood sugar twice a day and prn 100 each 12   insulin glargine (LANTUS) 100 UNIT/ML injection Inject 15 Units into the skin daily.     levocetirizine (XYZAL) 5 MG tablet Take 5 mg by mouth every evening.  4   lisinopril (ZESTRIL) 40 MG tablet Take 40 mg by mouth daily.     metFORMIN (GLUCOPHAGE) 850 MG tablet Take 850 mg by mouth 2 (two) times daily with a meal.     Misc Natural Products (GINSENG COMPLEX PO) Take 1 capsule by mouth daily. (Patient not taking: Reported on 09/06/2021)     Na Sulfate-K Sulfate-Mg Sulf (SUPREP BOWEL PREP KIT) 17.5-3.13-1.6 GM/177ML SOLN Take 1 kit by mouth as directed. 324 mL 0   Omega-3 Fatty Acids (FISH OIL) 1000 MG CAPS Take 1,000 mg by mouth daily.     pantoprazole (PROTONIX) 20 MG tablet Take 20 mg by mouth daily.  2   No current facility-administered medications for this visit.    REVIEW OF SYSTEMS:   Constitutional: Denies fevers, chills or abnormal night sweats Eyes: Denies blurriness of vision, double vision or watery eyes Ears, nose, mouth, throat, and face: Denies  mucositis or sore throat Respiratory: Denies cough, dyspnea or wheezes Cardiovascular: Denies palpitation, chest discomfort or lower extremity swelling Gastrointestinal:  Denies nausea, heartburn or change in bowel habits Skin: Denies abnormal skin rashes Lymphatics: Denies new lymphadenopathy or easy bruising Neurological:Denies numbness, tingling or new weaknesses Behavioral/Psych: Mood is stable, no new changes  All other systems were reviewed with the patient and are negative.  PHYSICAL EXAMINATION: ECOG PERFORMANCE STATUS: 1 - Symptomatic but completely ambulatory  Vitals:   10/10/22 1142  BP: (!) 159/80  Pulse: 92  Resp: 18  Temp: 98 F (36.7 C)  SpO2: 100%   Filed Weights   10/10/22 1142  Weight: 158 lb 9.6 oz (71.9 kg)    GENERAL:alert, no distress and comfortable SKIN: skin color, texture, turgor are normal, no rashes or significant lesions EYES: normal, conjunctiva are pink and non-injected, sclera clear OROPHARYNX:no exudate, normal lips, buccal mucosa, and tongue  NECK: supple, thyroid normal size, non-tender, without nodularity LYMPH:  no palpable lymphadenopathy in the cervical, axillary or inguinal LUNGS: clear to auscultation and percussion with normal breathing effort HEART: regular rate & rhythm and no murmurs without lower extremity edema ABDOMEN:abdomen soft, non-tender and normal bowel sounds Musculoskeletal:no cyanosis of digits and no clubbing.  Noted well-healed surgical scar PSYCH: alert & oriented x 3 with fluent speech NEURO: no focal motor/sensory deficits.  Noted mild restriction in range of motion of his upper extremity  LABORATORY DATA:  I have reviewed the data as listed Lab Results  Component Value Date   WBC 4.8 01/21/2022   HGB 11.7 (L) 01/21/2022   HCT 35.7 (L) 01/21/2022   MCV 86.0 01/21/2022   PLT 375 01/21/2022   Recent Labs    12/26/21 0948 01/21/22 1054  NA 135 135  K 4.6 4.7  CL 100 101  CO2 30 26  GLUCOSE 245* 188*   BUN 23 21  CREATININE 1.23 1.27*  CALCIUM 9.5 9.0  GFRNONAA >60 59*  PROT 7.5 7.5  ALBUMIN 4.0 4.0  AST 24 18  ALT 35 19  ALKPHOS 89 81  BILITOT 0.5 0.6    RADIOGRAPHIC STUDIES: I have personally reviewed the radiological images as listed and agreed with the findings in the report. CT Chest Wo Contrast  Result Date: 09/13/2022 CLINICAL DATA:  77 year old male presents for follow-up of chest wall pleomorphic sarcoma post surgery and radiotherapy. * Tracking Code: BO * EXAM: CT CHEST WITHOUT CONTRAST TECHNIQUE: Multidetector CT imaging of the chest was performed following the standard protocol without IV contrast. RADIATION DOSE REDUCTION: This exam was performed according to the departmental dose-optimization program which includes automated exposure control, adjustment of the mA and/or kV according to patient size and/or use of iterative reconstruction technique. COMPARISON:  March 16, 2022 FINDINGS: Cardiovascular: Noncontrast appearance of the heart great vessels is unremarkable. Mediastinum/Nodes: No thoracic inlet, axillary, mediastinal or hilar adenopathy. Esophagus grossly normal. Lungs/Pleura: Ground-glass and septal thickening with parenchymal distortion in the posterior RIGHT lower lobe compatible with post radiation changes abutting the posterior pleura show mild volume loss compared to previous imaging from June of 2023. No consolidation elsewhere or sign of pleural effusion. Airways are patent. Upper Abdomen: Fissural widening of hepatic fissures, liver incompletely imaged. Stable low-attenuation foci in the RIGHT hemiliver. Imaged portions of pancreas, spleen, adrenal glands and kidneys without acute process. No acute upper abdominal findings. Musculoskeletal: Post resection changes in the RIGHT posterior paramidline chest wall inferior to the scapula and medial to the scapula appear to be increased in thickness. When measured by this observer on the previous study. This currently  measures 6.8 x 1.8 cm as compared to 5.8 x 1.6 cm, in the inferior aspect measurements of 4.3 x 1.1 cm within 1-2 mm of previous measurements (image 67/2) appears more ovoid on image 49/2 at the upper margin that it did previously as well. Still with internal low attenuation at 6 Hounsfield units. This area is however better evaluated on the recent MRI where it showed no concerning features. Subtle increase in size not withstanding. IMPRESSION: 1. Slight increase in size  of chest wall seroma better evaluated on recent MRI. Attention on follow-up. 2. No signs of disease metastasis in the chest with evolving post radiation changes in the posterior RIGHT chest. Electronically Signed   By: Zetta Bills M.D.   On: 09/13/2022 13:25    ASSESSMENT & PLAN:  Sarcoma of chest wall [facility]) The patient has completed adjuvant radiation therapy I have reviewed his recent CT chest and MRI which show no evidence of residual disease We will continue surveillance imaging study again in 6 months  Physical debility He has developed significant restriction in range of motion of his right upper extremity from surgery and radiation induced fibrosis This is causing interference with activities of daily living including his inability to enjoy golfing Recommend resumption of physical therapy and rehab and he is in agreement I will send a referral  Diabetes mellitus without complication ([place]) Despite elevated hemoglobin A1c, the patient had recurrent episodes of hypoglycemia from insulin therapy He is very active with physical exercise including golfing for many hours weekly We discussed importance of dietary modification and lifestyle changes  Orders Placed This Encounter  Procedures   MR Chest W Contrast    Standing Status:   Future    Standing Expiration Date:   10/11/2023    Order Specific Question:   Reason for Exam (SYMPTOM  OR DIAGNOSIS REQUIRED)    Answer:   resection of sarcoma near right scapula    Order  Specific Question:   If indicated for the ordered procedure, I authorize the administration of contrast media per Radiology protocol    Answer:   Yes    Order Specific Question:   What is the patient's sedation requirement?    Answer:   No Sedation    Order Specific Question:   Does the patient have a pacemaker or implanted devices?    Answer:   No    Order Specific Question:   Preferred imaging location?    Answer:   [facility] (table limit - 550 lbs)    All questions were answered. The patient knows to call the clinic with any problems, questions or concerns. The total time spent in the appointment was 60 minutes encounter with patients including review of chart and various tests results, discussions about plan of care and coordination of care plan   Heath Lark, MD 10/11/2022 10:17 AM

## 2022-10-17 NOTE — Therapy (Signed)
OUTPATIENT PHYSICAL THERAPY ONCOLOGY TREATMENT  Patient Name: Cory Maddox MRN: [id number] DOB:20-Aug-1946, 77 y.o., male Today's Date: 10/18/2022   PT End of Session - 10/18/22 0903     Visit Number 8    Number of Visits 16    Date for PT Re-Evaluation 11/15/22    PT Start Time 0906    PT Stop Time 0945    PT Time Calculation (min) 39 min    Activity Tolerance Patient tolerated treatment well    Behavior During Therapy [facility] for tasks assessed/performed                Past Medical History:  Diagnosis Date   Arthritis    Diabetes mellitus without complication (HCC)    GERD (gastroesophageal reflux disease)    History of shingles    Hypertension    Sarcoma (HCC) 08/15/2021   Past Surgical History:  Procedure Laterality Date   MASS EXCISION Right 07/27/2021   Procedure: RADICAL EXCISION OF MALIGNANT MASS RIGHT BACK;  Surgeon: Almond Lint, MD;  Location: [facility] OR;  Service: General;  Laterality: Right;   Patient Active Problem List   Diagnosis Date Noted   Physical debility 10/10/2022   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   Hypertension    History of shingles    Diabetes mellitus without complication (HCC)    Arthritis     PCP: Johny Blamer, MD REFERRING PROVIDER: Almond Lint MD  REFERRING DIAG: s/p Excision Right Shoulder/Back Sarcoma with radiation THERAPY DIAG:   Aftercare following surgery for neoplasm  Stiffness of right shoulder, not elsewhere classified  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 My shoulder in the last week has gotten very  tight. I didn't golf for 2 and a half weeks. I played well and didn't have pain. That day I had good motion in my shoulder. I saw Dr. Bertis Ruddy and she told me I won't get all my ROM back because of scar tissue. Pain is sometimes like a spasm that radiates down from the shoulder blade. I need to work on the manual things to loosen up again.   PERTINENT HISTORY:  Pt underwent resection  of a right shoulder/back sarcoma on 07/27/21. This was a  high grade undifferentiated pleomorphic sarcoma. Margins were negative but close. He had minimal pain. He developed lower extremity edema and went to the ED. He was found to have a left tibial vein clot and was placed on eliquis. The tumor was high grade and he underwent radiation finishing on 11/04/2021 He has also had several months since May 6 of significant right upper quadrant pain and decreased right shoulder ROM. He had multiple scans that did not show tumor reoccurence.   PAIN:  PAIN:  Are you having pain? Yes NPRS scale: 2/10- 4/10 over the past month. 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[employer]  LEISURE: golf, outdoor activities, yard work, walking, gym 3 days per week  HAND DOMINANCE: right   PRIOR LEVEL OF FUNCTION: Independent  PATIENT GOALS: Improve ROM of right shoulder   OBJECTIVE:  COGNITION: Overall cognitive status: Within functional limits for tasks assessed   PALPATION: Tender along medial border of right scapula over incisional area and slightly  lateral to incision, decreased scar and scapular mobility  OBSERVATIONS / OTHER ASSESSMENTS: 16 cm incison healed from upper scapula along medial scapular border to inferior scapula, upper trap compensation with reaching activities  SENSATION: Light touch: Deficits   decreased to ight touch at right scapular area POSTURE: forward head, rounded shoulders  UPPER EXTREMITY AROM/PROM:  A/PROM RIGHT   eval  RIGHT 08/02/22 08/14/22 10/18/2022 RE-eval  Shoulder extension 36 44 45 42  Shoulder flexion 108 scapular compensation  132 140 115  Shoulder abduction 120 with compensation 141 141 130  Shoulder internal rotation 42 25 43 45  Shoulder external rotation 63 70 68 70    (Blank rows = not tested)  A/PROM LEFT   eval LEFT 10/18/2022  Shoulder extension 52 51  Shoulder flexion 140 140  Shoulder abduction 132 139  Shoulder internal rotation 50 39  Shoulder external rotation 71 72    (Blank rows = not tested)  UPPER EXTREMITY STRENGTH: 5/5 left in available motion, 4 to 4+/5 right, UT compensation  LYMPHEDEMA ASSESSMENTS:   SURGERY TYPE/DATE: 07/27/2021 NUMBER OF LYMPH NODES REMOVED: 0 CHEMOTHERAPY: no RADIATION:Yes ended 10/2021 HORMONE TREATMENT: NO INFECTIONS: NO  QUICK DASH SURVEY: 22%  TODAY'S TREATMENT:    10/18/2022 Posterior shoulde stretch x 3 Supine wand flexion and scaption x 3  Serratus on wall x 10 Abd wall slides x 10     08/28/2022  STM: In left S/L Scar massage at right scapular incision and lats. Scapular mobs protraction and retraction in Lt S/L Suction cupping to R scapular area and inferior to scar Supine wand flexion and scaption  2 positions x 4 PROM in supine in direction of right shoulder flexion, scaption, abd, ER, IR and D2 Push ups on wall with a plus x10 to isolate scapular muscles 4 D ball rolls on wall x 10    08-18-22: Therapeutic Exercises: held on gym due to pt request due to feeling stretched out after MT Manual Therapy STM: In  left S/L Scar massage at right scapular incision and lats.  Scapular mobs protraction and retraction in Lt S/L PROM in supine in direction of right shoulder flexion, scaption, abd, ER, IR and D2 with scapular depression throughout by therapist due to muscle tightness MWM into sidelying abduction and ER with GH stabilization and scapular assist  08-14-22: Therapeutic Exercises Pulleys into flexion and abduction x2 mins each returning therapist demo and tactile cues to decrease Rt scapular compensation Ball roll up wall into Rt abduction x10 returning therapist demo  Forearm walking up wall x5, then same position for scapular retraction x5 each and returning therapist demo Manual Therapy STM: In left S/L Scar massage at right scapular incision and lats. Scapular mobs protraction and retraction in Lt S/L Suction cupping to R scapular area and inferior to scar with greatly improved skin mobility noted afterwards PROM in supine in direction of right shoulder flexion, scaption, abd, ER, IR and D2 with scapular depression throughout by therapist due to muscle tightness   PATIENT EDUCATION:  Education details: Access Code: [email or web address] URL: [email or web address] Date: 07/24/2022 Prepared by: Alvira Monday  Exercises - Supine Shoulder Flexion with Dowel  - 2 x daily - 7 x weekly - 1 sets - 5 reps - 10-15 hold - Standing Shoulder Extension with Dowel  - 2 x daily - 7 x weekly - 1 sets - 5 reps - 10-15 hold - Seated Scapular Retraction  - 2 x daily - 7 x weekly - 1 sets - 10 reps - 3 hold - Single Arm Shoulder Post Capsule Stretch  - 2 x daily - 7 x weekly - 1 sets - 3 reps - 15 hold - Standing Shoulder Internal Rotation Stretch with Hands Behind Back  - 2 x daily - 7 x weekly - 1 sets - 5 reps - 10-15 hold Person educated: Patient Education method: Explanation, Demonstration, and Handouts Education comprehension: verbalized understanding and returned demonstration  HOME EXERCISE  PROGRAM:  Supine Shoulder Flexion with Dowel  - 2 x daily - 7 x weekly - 1 sets - 5 reps - 10-15 hold - Standing Shoulder Extension with Dowel  - 2 x daily - 7 x weekly - 1 sets - 5 reps - 10-15 hold - Seated Scapular Retraction  - 2 x daily - 7 x weekly - 1 sets - 10 reps - 3 hold - Single Arm Shoulder Post Capsule Stretch  - 2 x daily - 7 x weekly - 1 sets - 3 reps - 15 hold - Standing Shoulder Internal Rotation Stretch with Hands Behind Back  - 2 x daily - 7 x weekly - 1 sets - 5 reps - 10-15 hold   ASSESSMENT:  CLINICAL IMPRESSION: Pt was seen in therapy from 07/24/2022 to 08/28/2022 and at that time  Pt noted 75-80 % improvement in pain but was frustrated with continued decrease in ROM.  Work activities had been hard on him and he wanted to wait until after the holidays to continue. He returns today with decreased AROM of the right shoulder and continued scapular pain and weakness. He is nearly able to put his wallet in his pocket, and pain although improved can reach a 4/10 at worst with some spasm that is now radiating downward.He will benefit from continuation of skilled therapy to address remaining deficits to return to PLOF OBJECTIVE IMPAIRMENTS: decreased knowledge of condition, decreased ROM, decreased strength, impaired flexibility, impaired sensation, impaired UE functional use, and pain.   ACTIVITY LIMITATIONS: reach over head  PARTICIPATION LIMITATIONS:  golf  PERSONAL FACTORS: 1-2 comorbidities: Right  upper back/shoulder sarcoma s/p radiation, DM  are also affecting patient's functional outcome.   REHAB POTENTIAL: Good  CLINICAL DECISION MAKING: Stable/uncomplicated  EVALUATION COMPLEXITY: Low  GOALS: Goals reviewed with patient? Yes  SHORT TERM GOALS: Target date:11/01/2022  Pt will be independent in a HEP to improve shoulder ROM and strength Baseline: Goal status: achieved 2.  Pt will improve right shoulder AROM flexion and abd by 10-15 degrees Baseline: flex 115,  abd 130 at re-eval Goal status:in Progress  3.  Pt will have right shoulder extension increased to 46 degrees Baseline: 36, 42 at re-eval Goal status:In progress  4  Pt will be able to put a belt in normally  Baseline:unable  Goal Status: In progress  LONG TERM GOALS: Target date: 11/15/2022   Pt will be able to put his hand/wallet in/out of his right back pocket Baseline: unable Goal status:Progressing  2.  Pt. Will note improved back swing with golf by atleast 50% Baseline:  Goal status:in progress  3.  Right shoulder flexion and abduction will improve to 140 degrees for improved reaching ability Baseline: flex Goal status:   4.  Quick dash will be improved to no greater than 10% Baseline:  Goal status: In progress  5. Pt will have decreased UT/scapular compensation for improved overhead reaching and right shoulder strength will be 5/5 throughout PLAN:  Baseline:    Goal;Ongoing  PT FREQUENCY: 2x/week  PT DURATION: 4 weeks  PLANNED INTERVENTIONS: Therapeutic exercises, Therapeutic activity, Neuromuscular re-education, Patient/Family education, Self Care, Joint mobilization, Dry Needling, Spinal mobilization, scar mobilization, and Manual therapy  PLAN FOR NEXT SESSION: Cont AA/ROM and scapular stability, add yellow theraband; cont suction cupping to R scapular area avoiding seroma location, scar mobilization right scapular area, scapular and joint mobs, progress HEP prn, STM prn around scapular/lat area,progress to scapular stability,  DN?   Alvira Monday, PT  10/18/2022  9:52 AM

## 2022-10-18 ENCOUNTER — Ambulatory Visit: Payer: Medicare Other | Attending: Hematology and Oncology

## 2022-10-18 DIAGNOSIS — C496 Malignant neoplasm of connective and soft tissue of trunk, unspecified: Secondary | ICD-10-CM | POA: Diagnosis not present

## 2022-10-18 DIAGNOSIS — R5381 Other malaise: Secondary | ICD-10-CM | POA: Diagnosis not present

## 2022-10-18 DIAGNOSIS — M25611 Stiffness of right shoulder, not elsewhere classified: Secondary | ICD-10-CM | POA: Diagnosis not present

## 2022-10-18 DIAGNOSIS — Z483 Aftercare following surgery for neoplasm: Secondary | ICD-10-CM

## 2022-10-24 ENCOUNTER — Ambulatory Visit: Payer: Medicare Other | Attending: Hematology and Oncology

## 2022-10-24 DIAGNOSIS — M25611 Stiffness of right shoulder, not elsewhere classified: Secondary | ICD-10-CM

## 2022-10-24 DIAGNOSIS — C496 Malignant neoplasm of connective and soft tissue of trunk, unspecified: Secondary | ICD-10-CM | POA: Diagnosis not present

## 2022-10-24 DIAGNOSIS — Z483 Aftercare following surgery for neoplasm: Secondary | ICD-10-CM | POA: Diagnosis not present

## 2022-10-24 NOTE — Therapy (Signed)
OUTPATIENT PHYSICAL THERAPY ONCOLOGY TREATMENT  Patient Name: Cory Maddox MRN: [id number] DOB:07-Apr-1946, 77 y.o., male Today's Date: 10/24/2022   PT End of Session - 10/24/22 0757     Visit Number 9    Number of Visits 16    Date for PT Re-Evaluation 11/15/22    PT Start Time 0800    PT Stop Time 7619    PT Time Calculation (min) 47 min    Activity Tolerance Patient tolerated treatment well    Behavior During Therapy [facility] for tasks assessed/performed                Past Medical History:  Diagnosis Date   Arthritis    Diabetes mellitus without complication ([place])    GERD (gastroesophageal reflux disease)    History of shingles    Hypertension    Sarcoma ([place]) 08/15/2021   Past Surgical History:  Procedure Laterality Date   MASS EXCISION Right 07/27/2021   Procedure: RADICAL EXCISION OF MALIGNANT MASS RIGHT BACK;  Surgeon: Stark Klein, MD;  Location: [place];  Service: General;  Laterality: Right;   Patient Active Problem List   Diagnosis Date Noted   Physical debility 10/10/2022   Sarcoma of chest wall ([place]) 09/06/2021   Hypertension    History of shingles    Diabetes mellitus without complication ([place])    Arthritis     PCP: Shirline Frees, MD REFERRING PROVIDER: Stark Klein MD  REFERRING DIAG: s/p Excision Right Shoulder/Back Sarcoma with radiation THERAPY DIAG:   Aftercare following surgery for neoplasm  Stiffness of right shoulder, not elsewhere classified  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 I got my stretches in. I felt better afterwards. I played golf a few times and my swing is improving  PERTINENT  HISTORY:  Pt underwent resection  of a right shoulder/back sarcoma on 07/27/21. This was a high grade undifferentiated pleomorphic sarcoma. Margins were negative but close. He had minimal pain. He developed lower extremity edema and went to the ED. He was found to have a left tibial vein clot and was placed on eliquis. The tumor was high grade and he underwent radiation finishing on 11/04/2021 He has also had several months since May 6 of significant right upper quadrant pain and decreased right shoulder ROM. He had multiple scans  that did not show tumor reoccurence.   PAIN:  PAIN:  Are you having pain? Yes NPRS scale: 1/10 below right shoulder blad Right scapular region Dull feeling Worse:leaning against chair Better; Tylenol,exercises  PRECAUTIONS: DM on insulin, multiple subacute fx deformities at  T4-5 and T6 spinous processes, OA in hands  WEIGHT BEARING RESTRICTIONS: No  FALLS:  Has patient fallen in last 6 months? 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[employer]  LEISURE: golf, outdoor activities, yard work, walking, gym 3 days per week  HAND DOMINANCE: right   PRIOR LEVEL OF FUNCTION: Independent  PATIENT GOALS: Improve ROM of right shoulder   OBJECTIVE:  COGNITION: Overall cognitive status: Within functional limits for tasks assessed   PALPATION: Tender along medial border of right scapula over incisional area and slightly lateral to incision, decreased scar and scapular mobility  OBSERVATIONS / OTHER ASSESSMENTS: 16 cm incison healed from upper scapula along medial scapular border to inferior scapula, upper trap compensation with reaching activities  SENSATION: Light touch: Deficits   decreased to ight touch at right scapular area POSTURE: forward head, rounded  shoulders  UPPER EXTREMITY AROM/PROM:  A/PROM RIGHT   eval  RIGHT 08/02/22 08/14/22 10/18/2022 RE-eval  Shoulder extension 36 44 45 42  Shoulder flexion 108 scapular compensation  132 140 115  Shoulder abduction 120 with compensation 141 141 130  Shoulder internal rotation 42 25 43 45  Shoulder external rotation 63 70 68 70    (Blank rows = not tested)  A/PROM LEFT   eval LEFT 10/18/2022  Shoulder extension 52 51  Shoulder flexion 140 140  Shoulder abduction 132 139  Shoulder internal rotation 50 39  Shoulder external rotation 71 72    (Blank rows = not tested)  UPPER EXTREMITY STRENGTH: 5/5 left in available motion, 4 to 4+/5 right, UT compensation  LYMPHEDEMA ASSESSMENTS:   SURGERY TYPE/DATE: 07/27/2021 NUMBER OF LYMPH NODES REMOVED: 0 CHEMOTHERAPY: no RADIATION:Yes ended 10/2021 HORMONE TREATMENT: NO INFECTIONS: NO  QUICK DASH SURVEY: 22%  TODAY'S TREATMENT:    10/24/2022 Pulleys for flexion and abduction x 2 min ea  Ball rolls on wall x 10 for flexion and abduction x5 with end range stretch STM: In left S/L Scar massage at right scapular incision and lats. Scapular mobs protraction and retraction in Lt S/L Suction cupping to R scapular area and inferior to scar  PROM right shouler flexion, scaption abduction Serratus on table x 10 no wt UT lift off x5 held secondary to compensation Wall clock 6 to 2 x 5 4 D ball rolls x 10 ea direction  10/18/2022 Posterior shoulde stretch x 3 Supine wand flexion and scaption x 3  Serratus on wall x 10 Abd wall slides x 10  08/28/2022  STM: In left S/L Scar massage at right scapular incision and lats. Scapular mobs protraction and retraction in Lt S/L Suction cupping to R scapular area and inferior to scar Supine wand flexion and scaption  2 positions x 4 PROM in supine in direction of right shoulder flexion, scaption, abd, ER, IR and D2 Push ups on wall with a plus x10 to isolate scapular muscles 4 D ball rolls on wall  x 10    08-18-22: Therapeutic Exercises: held on gym due to pt request due to feeling stretched out after  MT Manual Therapy STM: In left S/L Scar massage at right scapular incision and lats. Scapular mobs protraction and retraction in Lt S/L PROM in supine in direction of right shoulder flexion, scaption, abd, ER, IR and D2 with scapular depression throughout by therapist due to muscle tightness MWM into sidelying abduction and ER with [place] stabilization and scapular assist  08-14-22: Therapeutic Exercises Pulleys into flexion and abduction x2 mins each returning therapist demo and tactile cues to decrease Rt scapular compensation Ball roll up wall into Rt abduction x10 returning therapist demo  Forearm walking up wall x5, then same position for scapular retraction x5 each and returning therapist demo Manual Therapy STM: In left S/L Scar massage at right scapular incision and lats. Scapular mobs protraction and retraction in Lt S/L Suction cupping to R scapular area and inferior to scar with greatly improved skin mobility noted afterwards PROM in supine in direction of right shoulder flexion, scaption, abd, ER, IR and D2 with scapular depression throughout by therapist due to muscle tightness   PATIENT EDUCATION:  Education details: Access Code: [email or web address] URL: [email or web address] Date: 07/24/2022 Prepared by: Cheral Almas  Exercises - Supine Shoulder Flexion with Dowel  - 2 x daily - 7 x weekly - 1 sets - 5 reps - 10-15 hold - Standing Shoulder Extension with Dowel  - 2 x daily - 7 x weekly - 1 sets - 5 reps - 10-15 hold - Seated Scapular Retraction  - 2 x daily - 7 x weekly - 1 sets - 10 reps - 3 hold - Single Arm Shoulder Post Capsule Stretch  - 2 x daily - 7 x weekly - 1 sets - 3 reps - 15 hold - Standing Shoulder Internal Rotation Stretch with Hands Behind Back  - 2 x daily - 7 x weekly - 1 sets - 5 reps - 10-15 hold Person educated: Patient Education method:  Explanation, Demonstration, and Handouts Education comprehension: verbalized understanding and returned demonstration  HOME EXERCISE PROGRAM:  Supine Shoulder Flexion with Dowel  - 2 x daily - 7 x weekly - 1 sets - 5 reps - 10-15 hold - Standing Shoulder Extension with Dowel  - 2 x daily - 7 x weekly - 1 sets - 5 reps - 10-15 hold - Seated Scapular Retraction  - 2 x daily - 7 x weekly - 1 sets - 10 reps - 3 hold - Single Arm Shoulder Post Capsule Stretch  - 2 x daily - 7 x weekly - 1 sets - 3 reps - 15 hold - Standing Shoulder Internal Rotation Stretch with Hands Behind Back  - 2 x daily - 7 x weekly - 1 sets - 5 reps - 10-15 hold   ASSESSMENT:  CLINICAL IMPRESSION: Pts scapular mobility is still limited, and he does still compensate with the UT's.  Pt was surprised how fatiqued he was with the relatively easy exercises we did this am, especially the 4 D ball rolls. Pain he had prior to rx was gone after cupping. OBJECTIVE IMPAIRMENTS: decreased knowledge of condition, decreased ROM, decreased strength, impaired flexibility, impaired sensation, impaired UE functional use, and pain.   ACTIVITY LIMITATIONS: reach over head  PARTICIPATION LIMITATIONS:  golf  PERSONAL FACTORS: 1-2 comorbidities: Right upper back/shoulder sarcoma s/p radiation, DM  are also affecting patient's functional outcome.   REHAB POTENTIAL: Good  CLINICAL DECISION MAKING: Stable/uncomplicated  EVALUATION COMPLEXITY: Low  GOALS: Goals reviewed with patient? Yes  SHORT TERM GOALS: Target date:11/01/2022  Pt will be independent in  a HEP to improve shoulder ROM and strength Baseline: Goal status: achieved 2.  Pt will improve right shoulder AROM flexion and abd by 10-15 degrees Baseline: flex 115, abd 130 at re-eval Goal status:in Progress  3.  Pt will have right shoulder extension increased to 46 degrees Baseline: 36, 42 at re-eval Goal status:In progress  4  Pt will be able to put a belt in  normally  Baseline:unable  Goal Status: In progress  LONG TERM GOALS: Target date: 11/15/2022   Pt will be able to put his hand/wallet in/out of his right back pocket Baseline: unable Goal status:Progressing  2.  Pt. Will note improved back swing with golf by atleast 50% Baseline:  Goal status:in progress  3.  Right shoulder flexion and abduction will improve to 140 degrees for improved reaching ability Baseline: flex Goal status:   4.  Quick dash will be improved to no greater than 10% Baseline:  Goal status: In progress  5. Pt will have decreased UT/scapular compensation for improved overhead reaching and right shoulder strength will be 5/5 throughout PLAN:  Baseline:    Goal;Ongoing  PT FREQUENCY: 2x/week  PT DURATION: 4 weeks  PLANNED INTERVENTIONS: Therapeutic exercises, Therapeutic activity, Neuromuscular re-education, Patient/Family education, Self Care, Joint mobilization, Dry Needling, Spinal mobilization, scar mobilization, and Manual therapy  PLAN FOR NEXT SESSION: Cont AA/ROM and scapular stability, add yellow theraband; cont suction cupping to R scapular area avoiding seroma location, scar mobilization right scapular area, scapular and joint mobs, progress HEP prn, STM prn around scapular/lat area,progress to scapular stability,  DN?   Cheral Almas, PT  10/24/2022  8:48 AM

## 2022-10-26 ENCOUNTER — Ambulatory Visit: Payer: Medicare Other

## 2022-10-26 DIAGNOSIS — M25611 Stiffness of right shoulder, not elsewhere classified: Secondary | ICD-10-CM | POA: Diagnosis not present

## 2022-10-26 DIAGNOSIS — C496 Malignant neoplasm of connective and soft tissue of trunk, unspecified: Secondary | ICD-10-CM

## 2022-10-26 DIAGNOSIS — Z483 Aftercare following surgery for neoplasm: Secondary | ICD-10-CM

## 2022-10-26 NOTE — Therapy (Addendum)
OUTPATIENT PHYSICAL THERAPY ONCOLOGY TREATMENT  Patient Name: Cory Maddox MRN: [id number] DOB:[birth year], 77 y.o., male Today's Date: 10/26/2022   PT End of Session - 10/26/22 0800     Visit Number 10    Number of Visits 16    Date for PT Re-Evaluation 11/15/22    PT Start Time 0802    PT Stop Time 0850    PT Time Calculation (min) 48 min    Activity Tolerance Patient tolerated treatment well    Behavior During Therapy [facility] for tasks assessed/performed                Past Medical History:  Diagnosis Date   Arthritis    Diabetes mellitus without complication ([place])    GERD (gastroesophageal reflux disease)    History of shingles    Hypertension    Sarcoma ([place]) 08/15/2021   Past Surgical History:  Procedure Laterality Date   MASS EXCISION Right 07/27/2021   Procedure: RADICAL EXCISION OF MALIGNANT MASS RIGHT BACK;  Surgeon: Stark Klein, MD;  Location: [facility];  Service: General;  Laterality: Right;   Patient Active Problem List   Diagnosis Date Noted   Physical debility 10/10/2022   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   Hypertension    History of shingles    Diabetes mellitus without complication ([place])    Arthritis     PCP: Shirline Frees, MD REFERRING PROVIDER: Stark Klein MD  REFERRING DIAG: s/p Excision Right Shoulder/Back Sarcoma with radiation THERAPY DIAG:   Aftercare following surgery for neoplasm  Stiffness of right shoulder, not elsewhere classified  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 Still very hard to reach behind my back.  PERTINENT HISTORY:  Pt underwent resection  of a right shoulder/back  sarcoma on 07/27/21. This was a high grade undifferentiated pleomorphic sarcoma. Margins were negative but close. He had minimal pain. He developed lower extremity edema and went to the ED. He was found to have a left tibial vein clot and was placed on eliquis. The tumor was high grade and he underwent radiation finishing on 11/04/2021 He has also had several months since May 6 of significant right upper quadrant pain and decreased right shoulder ROM. He had multiple scans that did not show tumor reoccurence.   PAIN:  PAIN:  Are  you having pain? No, just tightness today NPRS scale: 1/10 below right shoulder blad Right scapular region Dull feeling Worse:leaning against chair Better; Tylenol,exercises  PRECAUTIONS: DM on insulin, multiple subacute fx deformities at  T4-5 and T6 spinous processes, OA in hands  WEIGHT BEARING RESTRICTIONS: No  FALLS:  Has patient fallen in last 6 months? 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[employer]  LEISURE: golf, outdoor activities, yard work, walking, gym 3 days per week  HAND DOMINANCE: right   PRIOR LEVEL OF FUNCTION: Independent  PATIENT GOALS: Improve ROM of right shoulder   OBJECTIVE:  COGNITION: Overall cognitive status: Within functional limits for tasks assessed   PALPATION: Tender along medial border of right scapula over incisional area and slightly lateral to incision, decreased scar and scapular mobility  OBSERVATIONS / OTHER ASSESSMENTS: 16 cm incison healed from upper scapula along medial scapular border to inferior scapula, upper trap compensation with reaching activities  SENSATION: Light touch: Deficits   decreased to ight touch at right scapular area POSTURE: forward head, rounded shoulders  UPPER EXTREMITY AROM/PROM:  A/PROM  RIGHT   eval  RIGHT 08/02/22 08/14/22 10/18/2022 RE-eval  Shoulder extension 36 44 45 42  Shoulder flexion 108 scapular compensation  132 140 115  Shoulder abduction 120 with compensation 141 141 130  Shoulder internal rotation 42 25 43 45  Shoulder external rotation 63 70 68 70    (Blank rows = not tested)  A/PROM LEFT   eval LEFT 10/18/2022  Shoulder extension 52 51  Shoulder flexion 140 140  Shoulder abduction 132 139  Shoulder internal rotation 50 39  Shoulder external rotation 71 72    (Blank rows = not tested)  UPPER EXTREMITY STRENGTH: 5/5 left in available motion, 4 to 4+/5 right, UT compensation  LYMPHEDEMA ASSESSMENTS:   SURGERY TYPE/DATE: 07/27/2021 NUMBER OF LYMPH NODES REMOVED: 0 CHEMOTHERAPY: no RADIATION:Yes ended 10/2021 HORMONE TREATMENT: NO INFECTIONS: NO  QUICK DASH SURVEY: 22%  TODAY'S TREATMENT:    10/26/2022 Pulleys for flexion and abduction x 2 min ea  Ball rolls on wall x 10 for flexion and abduction x5 with end range stretch STM: In left S/L Scar massage at right scapular incision and lats. Scapular mobs protraction and retraction in Lt S/L Suction cupping to R scapular area and inferior to scar  PROM right shouler flexion, scaption abduction Supine sword red band x 15, ER with red x 15 Ssupine horizontal abduction with red x 15 Supine alphabet 3# A-Z  10/24/2022 Pulleys for flexion and abduction x 2 min ea  Ball rolls on wall x 10 for flexion and abduction x5 with end range stretch STM: In left S/L Scar massage at right scapular incision and lats. Scapular mobs protraction and retraction in Lt S/L Suction cupping to R scapular area and inferior to scar  PROM right shouler flexion, scaption abduction Serratus on table x 10 no wt UT lift off x5 held secondary to compensation Wall clock 6 to 2 x 5 4 D ball rolls x 10 ea direction  Wall clock 6 to 2 x 5   10/18/2022 Posterior shoulde stretch x 3 Supine wand flexion and scaption x 3   Serratus on wall x 10 Abd wall slides x 10  08/28/2022  STM: In left S/L Scar massage at right scapular  incision and lats. Scapular mobs protraction and retraction in Lt S/L Suction cupping to R scapular area and inferior to scar Supine wand flexion and scaption  2 positions x 4 PROM in supine in direction of right shoulder flexion, scaption, abd, ER, IR and D2 Push ups on wall with a plus x10 to isolate scapular muscles 4 D ball rolls on wall x 10    08-18-22: Therapeutic Exercises: held on gym due to pt request due to feeling stretched out after MT Manual Therapy STM: In left S/L Scar massage at right scapular incision and lats. Scapular mobs protraction and retraction in Lt S/L PROM in supine in direction of right shoulder flexion, scaption, abd, ER, IR and D2 with scapular depression throughout by therapist due to muscle tightness MWM into sidelying abduction and ER with [place] stabilization and scapular assist  08-14-22: Therapeutic Exercises Pulleys into flexion and abduction x2 mins each returning therapist demo and tactile cues to decrease Rt scapular compensation Ball roll up wall into Rt abduction x10 returning therapist demo  Forearm walking up wall x5, then same position for scapular retraction x5 each and returning therapist demo Manual Therapy STM: In left S/L Scar massage at right scapular incision and lats. Scapular mobs protraction and retraction in Lt S/L Suction cupping to R scapular area and inferior to scar with greatly improved skin mobility noted afterwards PROM in supine in direction of right shoulder flexion, scaption, abd, ER, IR and D2 with scapular depression throughout by therapist due to muscle tightness   PATIENT EDUCATION:    10/26/2022 Educated in supine scapular series except flexion x10-15 reps with red band, able to return demonstrate  Education details: Access Code: [email or web address] URL: [email or web address] Date: 07/24/2022 Prepared by:  Cheral Almas  Exercises - Supine Shoulder Flexion with Dowel  - 2 x daily - 7 x weekly - 1 sets - 5 reps - 10-15 hold - Standing Shoulder Extension with Dowel  - 2 x daily - 7 x weekly - 1 sets - 5 reps - 10-15 hold - Seated Scapular Retraction  - 2 x daily - 7 x weekly - 1 sets - 10 reps - 3 hold - Single Arm Shoulder Post Capsule Stretch  - 2 x daily - 7 x weekly - 1 sets - 3 reps - 15 hold - Standing Shoulder Internal Rotation Stretch with Hands Behind Back  - 2 x daily - 7 x weekly - 1 sets - 5 reps - 10-15 hold Person educated: Patient Education method: Explanation, Demonstration, and Handouts Education comprehension: verbalized understanding and returned demonstration  HOME EXERCISE PROGRAM:  Supine Shoulder Flexion with Dowel  - 2 x daily - 7 x weekly - 1 sets - 5 reps - 10-15 hold - Standing Shoulder Extension with Dowel  - 2 x daily - 7 x weekly - 1 sets - 5 reps - 10-15 hold - Seated Scapular Retraction  - 2 x daily - 7 x weekly - 1 sets - 10 reps - 3 hold - Single Arm Shoulder Post Capsule Stretch  - 2 x daily - 7 x weekly - 1 sets - 3 reps - 15 hold - Standing Shoulder Internal Rotation Stretch with Hands Behind Back  - 2 x daily - 7 x weekly - 1 sets - 5 reps - 10-15 hold   ASSESSMENT:  CLINICAL IMPRESSION: Pt is aware of small gains he is making and notices especially with his golf swing. Not havin pain now as much as discomfort. Scapular region  still very bound down from scarring,radiation.  OBJECTIVE IMPAIRMENTS: decreased knowledge of condition, decreased ROM, decreased strength, impaired flexibility, impaired sensation, impaired UE functional use, and pain.   ACTIVITY LIMITATIONS: reach over head  PARTICIPATION LIMITATIONS:  golf  PERSONAL FACTORS: 1-2 comorbidities: Right upper back/shoulder sarcoma s/p radiation, DM  are also affecting patient's functional outcome.   REHAB POTENTIAL: Good  CLINICAL DECISION MAKING: Stable/uncomplicated  EVALUATION COMPLEXITY:  Low  GOALS: Goals reviewed with patient? Yes  SHORT TERM GOALS: Target date:11/01/2022  Pt will be independent in a HEP to improve shoulder ROM and strength Baseline: Goal status: achieved 2.  Pt will improve right shoulder AROM flexion and abd by 10-15 degrees Baseline: flex 115, abd 130 at re-eval Goal status:in Progress  3.  Pt will have right shoulder extension increased to 46 degrees Baseline: 36, 42 at re-eval Goal status:In progress  4  Pt will be able to put a belt in normally  Baseline:unable  Goal Status: In progress  LONG TERM GOALS: Target date: 11/15/2022   Pt will be able to put his hand/wallet in/out of his right back pocket Baseline: unable Goal status:Progressing  2.  Pt. Will note improved back swing with golf by atleast 50% Baseline:  Goal status:in progress  3.  Right shoulder flexion and abduction will improve to 140 degrees for improved reaching ability Baseline: flex Goal status:   4.  Quick dash will be improved to no greater than 10% Baseline:  Goal status: In progress  5. Pt will have decreased UT/scapular compensation for improved overhead reaching and right shoulder strength will be 5/5 throughout PLAN:  Baseline:    Goal;Ongoing  PT FREQUENCY: 2x/week  PT DURATION: 4 weeks  PLANNED INTERVENTIONS: Therapeutic exercises, Therapeutic activity, Neuromuscular re-education, Patient/Family education, Self Care, Joint mobilization, Dry Needling, Spinal mobilization, scar mobilization, and Manual therapy  PLAN FOR NEXT SESSION: Cont AA/ROM and scapular stability, add yellow theraband; cont suction cupping to R scapular area avoiding seroma location, scar mobilization right scapular area, scapular and joint mobs, progress HEP prn, STM prn around scapular/lat area,progress to scapular stability,  DN? PHYSICAL THERAPY DISCHARGE SUMMARY  Visits from Start of Care: 10  Current functional level related to goals / functional outcomes: Goals not  assessed. Pt called to cancel his remaining appointments saying he is doing well   Remaining deficits: Reaching behind back, full golf swing   Education / Equipment: Einar Grad, HEP   Patient agrees to discharge. Patient goals were partially met. Patient is being discharged due to being pleased with the current functional level.   Cheral Almas, PT  10/26/2022  8:51 AM

## 2022-10-26 NOTE — Patient Instructions (Signed)
Side Pull: Double Arm   On back, knees bent, feet flat. Arms perpendicular to body, shoulder level, elbows straight but relaxed. Pull arms out to sides, elbows straight. Resistance band comes across collarbones, hands toward floor. Hold momentarily. Slowly return to starting position. Repeat _5-10__ times. Band color _yellow____   Sword   On back, knees bent, feet flat, left hand on left hip, right hand above left. Pull right arm DIAGONALLY (hip to shoulder) across chest. Bring right arm along head toward floor. Hold momentarily. Slowly return to starting position. Repeat _5-10__ times. Do with left arm. Band color _yellow_____   Shoulder Rotation: Double Arm   On back, knees bent, feet flat, elbows tucked at sides, bent 90, hands palms up. Pull hands apart and down toward floor, keeping elbows near sides. Hold momentarily. Slowly return to starting position. Repeat _5-10__ times. Band color __yellow____

## 2022-11-08 ENCOUNTER — Encounter: Payer: Self-pay | Admitting: Internal Medicine

## 2022-11-08 ENCOUNTER — Ambulatory Visit (AMBULATORY_SURGERY_CENTER): Payer: Medicare Other | Admitting: Internal Medicine

## 2022-11-08 VITALS — BP 146/98 | HR 70 | Temp 98.9°F | Resp 23 | Ht 67.0 in | Wt 154.0 lb

## 2022-11-08 DIAGNOSIS — R194 Change in bowel habit: Secondary | ICD-10-CM

## 2022-11-08 DIAGNOSIS — D123 Benign neoplasm of transverse colon: Secondary | ICD-10-CM | POA: Diagnosis not present

## 2022-11-08 DIAGNOSIS — B9681 Helicobacter pylori [H. pylori] as the cause of diseases classified elsewhere: Secondary | ICD-10-CM | POA: Diagnosis not present

## 2022-11-08 DIAGNOSIS — E119 Type 2 diabetes mellitus without complications: Secondary | ICD-10-CM | POA: Diagnosis not present

## 2022-11-08 DIAGNOSIS — K317 Polyp of stomach and duodenum: Secondary | ICD-10-CM | POA: Diagnosis not present

## 2022-11-08 DIAGNOSIS — K2950 Unspecified chronic gastritis without bleeding: Secondary | ICD-10-CM | POA: Diagnosis not present

## 2022-11-08 DIAGNOSIS — D122 Benign neoplasm of ascending colon: Secondary | ICD-10-CM

## 2022-11-08 DIAGNOSIS — D120 Benign neoplasm of cecum: Secondary | ICD-10-CM | POA: Diagnosis not present

## 2022-11-08 DIAGNOSIS — R1011 Right upper quadrant pain: Secondary | ICD-10-CM | POA: Diagnosis not present

## 2022-11-08 DIAGNOSIS — K31A12 Gastric intestinal metaplasia without dysplasia, involving the body (corpus): Secondary | ICD-10-CM | POA: Diagnosis not present

## 2022-11-08 DIAGNOSIS — I10 Essential (primary) hypertension: Secondary | ICD-10-CM | POA: Diagnosis not present

## 2022-11-08 MED ORDER — SODIUM CHLORIDE 0.9 % IV SOLN: 500.0000 mL | Freq: Once | INTRAVENOUS | Status: DC

## 2022-11-08 NOTE — Op Note (Signed)
[place] Patient Name: Cory Maddox Procedure Date: 11/08/2022 2:36 PM MRN: [id number] Endoscopist: Adline Mango [place] , , [id number] Age: 77 Referring MD:  Date of Birth: 1946/02/18 Gender: Male Account #: [id number] Procedure:                Colonoscopy Indications:              Change in bowel habits Medicines:                Monitored Anesthesia Care Procedure:                Pre-Anesthesia Assessment:                           - Prior to the procedure, a History and Physical                            was performed, and patient medications and                            allergies were reviewed. The patient's tolerance of                            previous anesthesia was also reviewed. The risks                            and benefits of the procedure and the sedation                            options and risks were discussed with the patient.                            All questions were answered, and informed consent                            was obtained. Prior Anticoagulants: The patient has                            taken no anticoagulant or antiplatelet agents. ASA                            Grade Assessment: II - A patient with mild systemic                            disease. After reviewing the risks and benefits,                            the patient was deemed in satisfactory condition to                            undergo the procedure.                           After obtaining informed consent, the colonoscope  was passed under direct vision. Throughout the                            procedure, the patient's blood pressure, pulse, and                            oxygen saturations were monitored continuously. The                            Olympus SN [id number] was introduced through the anus                            and advanced to the the terminal ileum. The                            colonoscopy was performed without  difficulty. The                            patient tolerated the procedure well. The quality                            of the bowel preparation was good. The terminal                            ileum, ileocecal valve, appendiceal orifice, and                            rectum were photographed. Scope In: 3:01:48 PM Scope Out: 3:26:30 PM Scope Withdrawal Time: 0 hours 14 minutes 18 seconds  Total Procedure Duration: 0 hours 24 minutes 42 seconds  Findings:                 The terminal ileum appeared normal.                           Three sessile polyps were found in the transverse                            colon, ascending colon and cecum. The polyps were 3                            to 7 mm in size. These polyps were removed with a                            cold snare. Resection and retrieval were complete.                           Non-bleeding internal hemorrhoids were found during                            retroflexion. Complications:            No immediate complications. Estimated Blood Loss:     Estimated blood loss was minimal. Impression:               -  The examined portion of the ileum was normal.                           - Three 3 to 7 mm polyps in the transverse colon,                            in the ascending colon and in the cecum, removed                            with a cold snare. Resected and retrieved.                           - Non-bleeding internal hemorrhoids. Recommendation:           - Discharge patient to home (with escort).                           - Await pathology results.                           - The findings and recommendations were discussed                            with the patient.                           - Return to GI clinic in 6 weeks. Dr Georgian Co "Lyndee Leo" [place],  11/08/2022 3:48:21 PM

## 2022-11-08 NOTE — Op Note (Signed)
[place] Patient Name: Cory Maddox Procedure Date: 11/08/2022 2:41 PM MRN: [id number] Endoscopist: Adline Mango [place] , , [id number] Age: 77 Referring MD:  Date of Birth: 10-10-1945 Gender: Male Account #: [id number] Procedure:                Upper GI endoscopy Indications:              Abdominal pain in the right upper quadrant Medicines:                Monitored Anesthesia Care Procedure:                Pre-Anesthesia Assessment:                           - Prior to the procedure, a History and Physical                            was performed, and patient medications and                            allergies were reviewed. The patient's tolerance of                            previous anesthesia was also reviewed. The risks                            and benefits of the procedure and the sedation                            options and risks were discussed with the patient.                            All questions were answered, and informed consent                            was obtained. Prior Anticoagulants: The patient has                            taken no anticoagulant or antiplatelet agents. ASA                            Grade Assessment: II - A patient with mild systemic                            disease. After reviewing the risks and benefits,                            the patient was deemed in satisfactory condition to                            undergo the procedure.                           After obtaining informed consent, the endoscope was  passed under direct vision. Throughout the                            procedure, the patient's blood pressure, pulse, and                            oxygen saturations were monitored continuously. The                            Olympus scope [id number] was introduced through the                            mouth, and advanced to the second part of duodenum.                            The  upper GI endoscopy was accomplished without                            difficulty. The patient tolerated the procedure                            well. Scope In: Scope Out: Findings:                 A non-obstructing Schatzki ring was found at the                            gastroesophageal junction.                           A small hiatal hernia was present.                           Two 4 to 6 mm sessile polyps with no bleeding and                            no stigmata of recent bleeding were found in the                            gastric body. These polyps were removed with a cold                            snare. Resection and retrieval were complete.                           A single 12 mm umbilicated polyp with no bleeding                            and no stigmata of recent bleeding was found in the                            gastric antrum. Biopsies were taken with a cold  forceps for histology.                           Localized mildly erythematous mucosa without                            bleeding was found in the gastric antrum. Biopsies                            were taken with a cold forceps for histology.                           The examined duodenum was normal. Complications:            No immediate complications. Estimated Blood Loss:     Estimated blood loss was minimal. Impression:               - Non-obstructing Schatzki ring.                           - Small hiatal hernia.                           - Two gastric polyps. Resected and retrieved.                           - A single gastric polyp. Biopsied.                           - Erythematous mucosa in the antrum. Biopsied.                           - Normal examined duodenum. Recommendation:           - Await pathology results.                           - Trial of Protonix (pantoprazole) 40 mg PO BID for                            8 weeks.                           - Perform a  colonoscopy today. Dr Georgian Co "Lyndee Leo" [place],  11/08/2022 3:42:26 PM

## 2022-11-08 NOTE — Progress Notes (Signed)
GASTROENTEROLOGY PROCEDURE H&P NOTE   Primary Care Physician: Shirline Frees, MD    Reason for Procedure:   RUQ ab pain, change in bowel habits, anemia  Plan:    EGD/colonoscopy  Patient is appropriate for endoscopic procedure(s) in the ambulatory ([place]) setting.  The nature of the procedure, as well as the risks, benefits, and alternatives were carefully and thoroughly reviewed with the patient. Ample time for discussion and questions allowed. The patient understood, was satisfied, and agreed to proceed.     HPI: Cory Maddox is a 77 y.o. male who presents for EGD/colonoscopy for evaluation of RUQ ab pain, change in bowel habits, anemia.  Patient was most recently seen in the Gastroenterology Clinic on 08/01/22.  No interval change in medical history since that appointment. Please refer to that note for full details regarding GI history and clinical presentation.   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 Authorizing Provider  amLODipine (NORVASC) 5 MG tablet Take 5 mg by mouth daily.   Yes [provider]  aspirin EC 81 MG tablet Take 81 mg by mouth daily. Swallow whole.   Yes [provider]  glipiZIDE (GLUCOTROL) 5 MG tablet Take 5 mg by mouth daily before breakfast.   Yes [provider]  insulin glargine (LANTUS) 100 UNIT/ML injection Inject 15 Units into the skin daily.   Yes [provider]  levocetirizine (XYZAL) 5 MG tablet Take 5 mg by mouth every  evening. 02/01/15  Yes [provider]  lisinopril (ZESTRIL) 40 MG tablet Take 40 mg by mouth daily. 05/16/21  Yes [provider]  metFORMIN (GLUCOPHAGE) 850 MG tablet Take 850 mg by mouth 2 (two) times daily with a meal.   Yes [provider]  pantoprazole (PROTONIX) 20 MG tablet Take 20 mg by mouth daily. 01/11/15  Yes [provider]  B-D UF III MINI PEN NEEDLES 31G X 5 MM MISC USE TO CHECK BLOOD SUGAR BID 08/08/17   [provider]  Blood Glucose Monitoring Suppl (ACCU-CHEK GUIDE) w/Device KIT U UTD TO CHECK BLOOD SUGAR 09/05/17   [provider]  Blood Pressure Monitoring (BLOOD PRESSURE MONITOR AUTOMAT) DEVI TO CHECK BLOOD PRESSURE TWICE DAILY AS DIRECTED 11/21/17   Inda Coke, PA  Continuous Blood Gluc Sensor (FREESTYLE LIBRE 3 SENSOR) MISC USE TO CHECK BLOOD SUGAR AS DIRECTED 07/20/22   [provider]  gabapentin (NEURONTIN) 100 MG capsule Take 1 capsule (100 mg total) by mouth 2 (two) times daily. 07/27/21 07/27/22  Stark Klein, MD  glucose blood (ACCU-CHEK GUIDE) test strip Use to check blood sugar twice a day and prn 11/21/17   Inda Coke, PA  Misc Natural Products [facility] COMPLEX PO) Take 1 capsule by mouth daily. Patient not taking: Reported on 09/06/2021    [provider]  Na Sulfate-K Sulfate-Mg Sulf (SUPREP BOWEL PREP KIT) 17.5-3.13-1.6 GM/177ML SOLN Take 1 kit by mouth as directed. 08/01/22   Sharyn Creamer, MD  Omega-3 Fatty Acids (  FISH OIL) 1000 MG CAPS Take 1,000 mg by mouth daily.    [provider]    Current Outpatient Medications  Medication Sig Dispense Refill   amLODipine (NORVASC) 5 MG tablet Take 5 mg by mouth daily.     aspirin EC 81 MG tablet Take 81 mg by mouth daily. Swallow whole.     glipiZIDE (GLUCOTROL) 5 MG tablet Take 5 mg by mouth daily before breakfast.     insulin glargine (LANTUS) 100 UNIT/ML injection Inject 15 Units into the skin daily.     levocetirizine (XYZAL)  5 MG tablet Take 5 mg by mouth every evening.  4   lisinopril (ZESTRIL) 40 MG tablet Take 40 mg by mouth daily.     metFORMIN (GLUCOPHAGE) 850 MG tablet Take 850 mg by mouth 2 (two) times daily with a meal.     pantoprazole (PROTONIX) 20 MG tablet Take 20 mg by mouth daily.  2   B-D UF III MINI PEN NEEDLES 31G X 5 MM MISC USE TO CHECK BLOOD SUGAR BID  3   Blood Glucose Monitoring Suppl (ACCU-CHEK GUIDE) w/Device KIT U UTD TO CHECK BLOOD SUGAR  0   Blood Pressure Monitoring (BLOOD PRESSURE MONITOR AUTOMAT) DEVI TO CHECK BLOOD PRESSURE TWICE DAILY AS DIRECTED 1 Device 0   Continuous Blood Gluc Sensor (FREESTYLE LIBRE 3 SENSOR) MISC USE TO CHECK BLOOD SUGAR AS DIRECTED     gabapentin (NEURONTIN) 100 MG capsule Take 1 capsule (100 mg total) by mouth 2 (two) times daily. 28 capsule 1   glucose blood (ACCU-CHEK GUIDE) test strip Use to check blood sugar twice a day and prn 100 each 12   Misc Natural Products (GINSENG COMPLEX PO) Take 1 capsule by mouth daily. (Patient not taking: Reported on 09/06/2021)     Na Sulfate-K Sulfate-Mg Sulf (SUPREP BOWEL PREP KIT) 17.5-3.13-1.6 GM/177ML SOLN Take 1 kit by mouth as directed. 324 mL 0   Omega-3 Fatty Acids (FISH OIL) 1000 MG CAPS Take 1,000 mg by mouth daily.     Current Facility-Administered Medications  Medication Dose Route Frequency Provider Last Rate Last Admin   0.9 %  sodium chloride infusion  500 mL Intravenous Once Sharyn Creamer, MD        Allergies as of 11/08/2022 - Review Complete 11/08/2022  Allergen Reaction Noted   Aciphex [rabeprazole] Other (See Comments) 07/25/2021   Lisinopril  10/26/2021   Pravastatin Other (See Comments) 07/25/2021   Simvastatin Other (See Comments) 07/25/2021    Family History  Problem Relation Age of Onset   Diabetes Mother    Colon cancer Neg Hx    Esophageal cancer Neg Hx    Rectal cancer Neg Hx    Stomach cancer Neg Hx     Social History   Socioeconomic History   Marital status: Married    Spouse  name: Not on file   Number of children: Not on file   Years of education: Not on file   Highest education level: Not on file  Occupational History   Not on file  Tobacco Use   Smoking status: Never   Smokeless tobacco: Never  Vaping Use   Vaping Use: Never used  Substance and Sexual Activity   Alcohol use: Not Currently    Alcohol/week: 5.0 standard drinks of alcohol    Types: 5 Glasses of wine per week   Drug use: No   Sexual activity: Yes    Comment: married  Other Topics Concern   Not on  file  Social History Narrative   Married   Retired from [employer] in 2011   Loves to [place] Strain: Not on file  Food Insecurity: Not on file  Transportation Needs: Not on file  Physical Activity: Not on file  Stress: Not on file  Social Connections: Not on file  Intimate Partner Violence: Not At Risk (09/06/2021)   Humiliation, Afraid, Rape, and Kick questionnaire    Fear of Current or Ex-Partner: No    Emotionally Abused: No    Physically Abused: No    Sexually Abused: No    Physical Exam: Vital signs in last 24 hours: BP (!) 158/71   Pulse 73   Temp 98.9 F (37.2 C) (Temporal)   Ht 5' 7"$  (1.702 m)   Wt 154 lb (69.9 kg)   SpO2 99%   BMI 24.12 kg/m  GEN: NAD EYE: Sclerae anicteric ENT: MMM CV: Non-tachycardic Pulm: No increased WOB GI: Soft NEURO:  Alert & Oriented   Christia Reading, MD [facility]   11/08/2022 2:03 PM

## 2022-11-08 NOTE — Patient Instructions (Signed)
YOU HAD AN ENDOSCOPIC PROCEDURE TODAY AT THE [facility]:   Refer to the procedure report that was given to you for any specific questions about what was found during the examination.  If the procedure report does not answer your questions, please call your gastroenterologist to clarify.  If you requested that your care partner not be given the details of your procedure findings, then the procedure report has been included in a sealed envelope for you to review at your convenience later.  YOU SHOULD EXPECT: Some feelings of bloating in the abdomen. Passage of more gas than usual.  Walking can help get rid of the air that was put into your GI tract during the procedure and reduce the bloating. If you had a lower endoscopy (such as a colonoscopy or flexible sigmoidoscopy) you may notice spotting of blood in your stool or on the toilet paper. If you underwent a bowel prep for your procedure, you may not have a normal bowel movement for a few days.  Please Note:  You might notice some irritation and congestion in your nose or some drainage.  This is from the oxygen used during your procedure.  There is no need for concern and it should clear up in a day or so.  SYMPTOMS TO REPORT IMMEDIATELY:  Following lower endoscopy (colonoscopy or flexible sigmoidoscopy):  Excessive amounts of blood in the stool  Significant tenderness or worsening of abdominal pains  Swelling of the abdomen that is new, acute  Fever of 100F or higher  Following upper endoscopy (EGD)  Vomiting of blood or coffee ground material  New chest pain or pain under the shoulder blades  Painful or persistently difficult swallowing  New shortness of breath  Fever of 100F or higher  Black, tarry-looking stools  For urgent or emergent issues, a gastroenterologist can be reached at any hour by calling [phone]. Do not use MyChart messaging for urgent concerns.    DIET:  We do recommend a small meal at first, but  then you may proceed to your regular diet.  Drink plenty of fluids but you should avoid alcoholic beverages for 24 hours.  ACTIVITY:  You should plan to take it easy for the rest of today and you should NOT DRIVE or use heavy machinery until tomorrow (because of the sedation medicines used during the test).    FOLLOW UP: Our staff will call the number listed on your records the next business day following your procedure.  We will call around 7:15- 8:00 am to check on you and address any questions or concerns that you may have regarding the information given to you following your procedure. If we do not reach you, we will leave a message.     If any biopsies were taken you will be contacted by phone or by letter within the next 1-3 weeks.  Please call us at [phone] if you have not heard about the biopsies in 3 weeks.    SIGNATURES/CONFIDENTIALITY: You and/or your care partner have signed paperwork which will be entered into your electronic medical record.  These signatures attest to the fact that that the information above on your After Visit Summary has been reviewed and is understood.  Full responsibility of the confidentiality of this discharge information lies with you and/or your care-partner.  

## 2022-11-08 NOTE — Progress Notes (Signed)
Sedate, gd SR, tolerated procedure well, VSS, report to RN 

## 2022-11-08 NOTE — Progress Notes (Signed)
VS completed CW.    Pt's states no medical or surgical changes since previsit or office visit.

## 2022-11-08 NOTE — Progress Notes (Signed)
Called to room to assist during endoscopic procedure.  Patient ID and intended procedure confirmed with present staff. Received instructions for my participation in the procedure from the performing physician.  

## 2022-11-09 ENCOUNTER — Telehealth: Payer: Self-pay | Admitting: *Deleted

## 2022-11-09 NOTE — Telephone Encounter (Signed)
  Follow up Call-     11/08/2022    1:47 PM  Call back number  Post procedure Call Back phone  # [phone]  Permission to leave phone message Yes     Patient questions:  Do you have a fever, pain , or abdominal swelling? No. Pain Score  0 *  Have you tolerated food without any problems? Yes.    Have you been able to return to your normal activities? Yes.    Do you have any questions about your discharge instructions: Diet   No. Medications  No. Follow up visit  No.  Do you have questions or concerns about your Care? No.  Actions: * If pain score is 4 or above: No action needed, pain <4.

## 2022-11-10 ENCOUNTER — Encounter: Payer: Medicare Other | Admitting: Internal Medicine

## 2022-11-14 ENCOUNTER — Encounter: Payer: Self-pay | Admitting: Internal Medicine

## 2022-11-16 ENCOUNTER — Encounter: Payer: Self-pay | Admitting: Internal Medicine

## 2022-11-16 NOTE — Progress Notes (Signed)
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 Recommend bismuth quadruple therapy for treatment: - Tetracycline 500 mg QID x 14 days - Flagyl 250 mg QID x 14 days - Bismuth subsalicylate [id number] mg QID x 14 days - PPI BID x 14 days  I will talk about how to check for eradication of infection in clinic with him. Let's arrange for GI clinic follow up in 6 weeks.

## 2022-11-17 ENCOUNTER — Telehealth: Payer: Self-pay | Admitting: Internal Medicine

## 2022-11-17 NOTE — Telephone Encounter (Signed)
Inbound call from patient ,states he received a letter saying that he need a OV but he just had a procedure done .Marland Kitchenwant to know if he need to do a follow up or can disregard the letter .Please advise

## 2022-11-20 ENCOUNTER — Other Ambulatory Visit: Payer: Self-pay

## 2022-11-20 MED ORDER — METRONIDAZOLE 250 MG PO TABS: 250.0000 mg | ORAL_TABLET | Freq: Four times a day (QID) | ORAL | 0 refills | Status: AC

## 2022-11-20 MED ORDER — TETRACYCLINE HCL 500 MG PO CAPS: 500.0000 mg | ORAL_CAPSULE | Freq: Four times a day (QID) | ORAL | 0 refills | Status: DC

## 2022-11-20 NOTE — Telephone Encounter (Signed)
Patient instructed for treatment of H Pylori which was found by biopsy. He states he is already taking pantoprazole "2 a day." He confirms his pharmacy. Agrees to pick up pepto-bismol regular strength tablets. He will take 2 tablets 4 times daily while on the antibiotics (14 days). He needs a follow up appointment but he is "at the gym."  Call later to assist with the appointment.

## 2022-11-22 ENCOUNTER — Other Ambulatory Visit: Payer: Self-pay | Admitting: Internal Medicine

## 2022-11-22 ENCOUNTER — Other Ambulatory Visit: Payer: Self-pay

## 2022-11-22 ENCOUNTER — Telehealth: Payer: Self-pay | Admitting: Internal Medicine

## 2022-11-22 MED ORDER — PANTOPRAZOLE SODIUM 40 MG PO TBEC: 40.0000 mg | DELAYED_RELEASE_TABLET | Freq: Two times a day (BID) | ORAL | 0 refills | Status: DC

## 2022-11-22 MED ORDER — DOXYCYCLINE HYCLATE 100 MG PO CAPS: 100.0000 mg | ORAL_CAPSULE | Freq: Two times a day (BID) | ORAL | 0 refills | Status: AC

## 2022-11-22 NOTE — Telephone Encounter (Signed)
Called the patient. No answer. Left a voicemail.  The doxycycline to replace the $600 dollar antibiotic, has been given to the pharmacy. We also changed the pantoprazole to a higher dose to take for the next 30 days as a part of the treatment for H Pylori. The pharmacy knows to not fill the pantoprazole 20 mg for now.  The patient needs to make a follow up appointment with Dr Lorenso Courier to occur in 6 to 8 weeks.

## 2022-11-22 NOTE — Telephone Encounter (Signed)
DOD Patient of Dr Lorenso Courier with a positive H Pylori by biopsy  The planned therapy was bismuth quadruple therapy for treatment: - Tetracycline 500 mg QID x 14 days - Flagyl 250 mg QID x 14 days - Bismuth subsalicylate [id number] mg QID x 14 days - PPI BID x 14 days  The Tetracycline will cost his $600.00 which he cannot afford. The patient asks that the treatment be changed.

## 2022-11-22 NOTE — Telephone Encounter (Signed)
Incoming call from patient returning a call from [facility]

## 2022-11-22 NOTE — Telephone Encounter (Signed)
Substitute Doxycycline 100 mg po bid, 14d for Tetracycline

## 2022-11-27 ENCOUNTER — Telehealth: Payer: Self-pay

## 2022-11-27 NOTE — Telephone Encounter (Signed)
Patient calls today reporting black streaks in his stool. He states "I have never seen this before."  Patient states, "I feel fine. I read that it can be caused by the Pepto-Bismol, but I wanted to see what you thought." Asked if he had any pain, patient denies. Patient agrees to continue taking his medications for the treatment of H Pylori.  Patient instructed to call us if he develops abdominal or rectal pain, begins feeling lightheaded or dizzy. He will call back with any concerns or questions.

## 2022-11-28 ENCOUNTER — Other Ambulatory Visit: Payer: Self-pay

## 2022-11-29 NOTE — Telephone Encounter (Signed)
Called the patient to inquire on how he is feeling. Patient states he is having "queasiness."  Offered Zofran 4 mg to take PRN 3 times a day per Dr Lorenso Courier. Patient states he will call back with the pharmacy to send the prescription to. He is presently out of town.

## 2022-12-01 ENCOUNTER — Other Ambulatory Visit: Payer: Self-pay

## 2022-12-01 MED ORDER — ONDANSETRON HCL 4 MG PO TABS: 4.0000 mg | ORAL_TABLET | Freq: Three times a day (TID) | ORAL | 0 refills | Status: AC | PRN

## 2022-12-01 NOTE — Telephone Encounter (Signed)
Incoming call from patient states he is returning a call for [employer]. Says he was under the impression there was nothing found during his colonoscopy but received a letter in the mail that says otherwise and says he should've been made aware. If pt doesn't answer he says to leave a message. Please advise

## 2022-12-01 NOTE — Telephone Encounter (Signed)
Patient contacted. He does want the Zofran. He is spacing the medications and reports nausea is not as intense, but he remains uncomfortable. Pharmacy confirmed. Patient has returned home and will use his normal pharmacy.

## 2022-12-14 DIAGNOSIS — H2511 Age-related nuclear cataract, right eye: Secondary | ICD-10-CM | POA: Diagnosis not present

## 2022-12-14 DIAGNOSIS — H25041 Posterior subcapsular polar age-related cataract, right eye: Secondary | ICD-10-CM | POA: Diagnosis not present

## 2022-12-14 DIAGNOSIS — H47011 Ischemic optic neuropathy, right eye: Secondary | ICD-10-CM | POA: Diagnosis not present

## 2022-12-14 DIAGNOSIS — Z961 Presence of intraocular lens: Secondary | ICD-10-CM | POA: Diagnosis not present

## 2022-12-26 ENCOUNTER — Telehealth: Payer: Self-pay

## 2022-12-26 NOTE — Telephone Encounter (Signed)
Called and given radiology scheduling #. He will call to schedule MRI.

## 2022-12-26 NOTE — Telephone Encounter (Signed)
-----   Message from Artis Delay, MD sent at 12/26/2022  3:35 PM EDT ----- Can you call and remind him to schedule MRI of his chest for next month?

## 2023-01-01 ENCOUNTER — Other Ambulatory Visit: Payer: Self-pay | Admitting: Hematology and Oncology

## 2023-01-01 ENCOUNTER — Telehealth: Payer: Self-pay

## 2023-01-01 DIAGNOSIS — C493 Malignant neoplasm of connective and soft tissue of thorax: Secondary | ICD-10-CM

## 2023-01-01 NOTE — Telephone Encounter (Signed)
Called regarding message from scheduler. He is requesting MRI be changed to no contrast. He does not want contrast even if you get better pictures and can see more. He states, "I will take my chances with no contrast". Message sent to Dr. Bertis Ruddy with above.

## 2023-01-08 DIAGNOSIS — E1165 Type 2 diabetes mellitus with hyperglycemia: Secondary | ICD-10-CM | POA: Diagnosis not present

## 2023-01-08 DIAGNOSIS — J301 Allergic rhinitis due to pollen: Secondary | ICD-10-CM | POA: Diagnosis not present

## 2023-01-08 DIAGNOSIS — E782 Mixed hyperlipidemia: Secondary | ICD-10-CM | POA: Diagnosis not present

## 2023-01-08 DIAGNOSIS — Z85831 Personal history of malignant neoplasm of soft tissue: Secondary | ICD-10-CM | POA: Diagnosis not present

## 2023-01-08 DIAGNOSIS — I10 Essential (primary) hypertension: Secondary | ICD-10-CM | POA: Diagnosis not present

## 2023-01-08 DIAGNOSIS — K219 Gastro-esophageal reflux disease without esophagitis: Secondary | ICD-10-CM | POA: Diagnosis not present

## 2023-01-22 DIAGNOSIS — E1165 Type 2 diabetes mellitus with hyperglycemia: Secondary | ICD-10-CM | POA: Diagnosis not present

## 2023-01-29 DIAGNOSIS — E113291 Type 2 diabetes mellitus with mild nonproliferative diabetic retinopathy without macular edema, right eye: Secondary | ICD-10-CM | POA: Diagnosis not present

## 2023-01-29 DIAGNOSIS — E113212 Type 2 diabetes mellitus with mild nonproliferative diabetic retinopathy with macular edema, left eye: Secondary | ICD-10-CM | POA: Diagnosis not present

## 2023-01-29 DIAGNOSIS — H2511 Age-related nuclear cataract, right eye: Secondary | ICD-10-CM | POA: Diagnosis not present

## 2023-02-09 ENCOUNTER — Ambulatory Visit (HOSPITAL_COMMUNITY)
Admission: RE | Admit: 2023-02-09 | Discharge: 2023-02-09 | Disposition: A | Discharge status: Home / Self Care | Payer: Medicare Other | Source: Ambulatory Visit | Attending: Hematology and Oncology | Admitting: Hematology and Oncology

## 2023-02-09 DIAGNOSIS — C493 Malignant neoplasm of connective and soft tissue of thorax: Secondary | ICD-10-CM | POA: Insufficient documentation

## 2023-02-09 DIAGNOSIS — R918 Other nonspecific abnormal finding of lung field: Secondary | ICD-10-CM | POA: Diagnosis not present

## 2023-02-13 ENCOUNTER — Inpatient Hospital Stay: Payer: Medicare Other | Attending: Hematology and Oncology | Admitting: Hematology and Oncology

## 2023-02-13 ENCOUNTER — Encounter: Payer: Self-pay | Admitting: Hematology and Oncology

## 2023-02-13 VITALS — BP 170/81 | HR 77 | Temp 98.1°F | Resp 18 | Ht 67.0 in | Wt 146.8 lb

## 2023-02-13 DIAGNOSIS — E119 Type 2 diabetes mellitus without complications: Secondary | ICD-10-CM | POA: Diagnosis not present

## 2023-02-13 DIAGNOSIS — C493 Malignant neoplasm of connective and soft tissue of thorax: Secondary | ICD-10-CM | POA: Insufficient documentation

## 2023-02-13 DIAGNOSIS — R5381 Other malaise: Secondary | ICD-10-CM

## 2023-02-13 NOTE — Assessment & Plan Note (Signed)
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

## 2023-02-13 NOTE — Assessment & Plan Note (Signed)
He is very active with physical exercise including golfing for many hours weekly We discussed importance of dietary modification and lifestyle changes

## 2023-02-13 NOTE — Assessment & Plan Note (Signed)
I have reviewed multiple imaging studies with the patient On clinical exam, there is no suspicious signs of cancer recurrence The size of the abnormalities noted on MRI was smaller but there are limitations to interpretation of the imaging study due to lack of contrast The patient is adamant that he would not have contrast studies We discussed the risk and benefits of biopsy Ultimately, he would like to be observe I will see him back in 3 months I will call him in 2 months to see if he is interested to get imaging study with contrast or not

## 2023-02-13 NOTE — Progress Notes (Signed)
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[facility]) I have reviewed multiple imaging studies with the patient On clinical exam, there is no suspicious signs of cancer recurrence The size of the abnormalities noted on MRI was smaller but there are limitations to interpretation of the imaging study due to lack of contrast The patient is adamant that he would not have contrast studies We discussed the risk and benefits of biopsy Ultimately, he would like to be observe I will see him back in 3 months I will call him in 2 months to see if he is interested to get imaging study with contrast or not  Diabetes mellitus without complication (HCC) He is very active with physical exercise including golfing for many hours weekly We discussed importance of dietary modification and lifestyle changes  Physical debility He has developed significant restriction in range of motion of his right upper extremity from surgery and radiation induced fibrosis This is causing interference with activities of daily living including his inability to enjoy golfing Overall, his range of motion has improved  Orders Placed This Encounter  Procedures   CBC with Differential/Platelet    Standing Status:   Standing    Number of Occurrences:   22    Standing Expiration Date:   02/13/2024   Comprehensive metabolic panel    Standing Status:   Standing    Number of Occurrences:   33    Standing Expiration Date:   02/13/2024    All questions were answered. The patient knows to call the clinic with any problems, questions or concerns. The total time spent in the appointment was 30 minutes encounter with patients including review of chart and various tests results, discussions about plan of care and coordination of care plan   Artis Delay, MD 02/13/2023 10:07 AM  INTERVAL HISTORY: Please see below for  problem oriented charting. he returns for surveillance follow-up for history of sarcoma The patient is very active He is playing golf and continues to go to the gym His range of motion's are still limited but slightly improved compared to our previous visit The numbness and tingling sensation has also improved He has made some dietary changes with less hypoglycemia episodes We discussed recent imaging studies and future plan of care  REVIEW OF SYSTEMS:   Constitutional: Denies fevers, chills or abnormal weight loss Eyes: Denies blurriness of vision Ears, nose, mouth, throat, and face: Denies mucositis or sore throat Respiratory: Denies cough, dyspnea or wheezes Cardiovascular: Denies palpitation, chest discomfort or lower extremity swelling Gastrointestinal:  Denies nausea, heartburn or change in bowel habits Skin: Denies abnormal skin rashes Lymphatics: Denies new lymphadenopathy or easy bruising Neurological:Denies numbness, tingling or new weaknesses Behavioral/Psych: Mood is stable, no new changes  All other systems were reviewed with the patient and are negative.  I have reviewed the past medical history, past surgical history, social history and family history with the patient and they are unchanged from previous note.  ALLERGIES:  is allergic to aciphex [rabeprazole], lisinopril, pravastatin, and simvastatin.  MEDICATIONS:  Current Outpatient Medications  Medication Sig Dispense Refill   ondansetron (ZOFRAN) 4 MG tablet Take 1 tablet (4 mg total) by mouth 3 (three) times daily as needed for nausea or vomiting. 20 tablet 0   amLODipine (NORVASC) 5 MG tablet Take 5 mg by mouth daily.     aspirin EC 81 MG  tablet Take 81 mg by mouth daily. Swallow whole.     Blood Glucose Monitoring Suppl (ACCU-CHEK GUIDE) w/Device KIT U UTD TO CHECK BLOOD SUGAR  0   Blood Pressure Monitoring (BLOOD PRESSURE MONITOR AUTOMAT) DEVI TO CHECK BLOOD PRESSURE TWICE DAILY AS DIRECTED 1 Device 0    Continuous Blood Gluc Sensor (FREESTYLE LIBRE 3 SENSOR) MISC USE TO CHECK BLOOD SUGAR AS DIRECTED     glipiZIDE (GLUCOTROL) 5 MG tablet Take 5 mg by mouth daily before breakfast.     glucose blood (ACCU-CHEK GUIDE) test strip Use to check blood sugar twice a day and prn 100 each 12   insulin glargine (LANTUS) 100 UNIT/ML injection Inject 15 Units into the skin daily.     levocetirizine (XYZAL) 5 MG tablet Take 5 mg by mouth every evening.  4   lisinopril (ZESTRIL) 40 MG tablet Take 40 mg by mouth daily.     metFORMIN (GLUCOPHAGE) 850 MG tablet Take 850 mg by mouth 2 (two) times daily with a meal.     pantoprazole (PROTONIX) 40 MG tablet TAKE 1 TABLET(40 MG) BY MOUTH TWICE DAILY FOR 8 WEEKS 180 tablet 0   No current facility-administered medications for this visit.    SUMMARY OF ONCOLOGIC HISTORY: Oncology History  Sarcoma of chest wall (HCC)  04/25/2021 Initial Diagnosis   He was found to have a mass on the trunk noted in August of 2022.  He was evaluated by Dr. Andrey Campanile and a biopsy showed a soft tissue tumor that is beyond a lipoma.     07/27/2021 Imaging   Large soft tissue sarcoma along the right posterior chest wall, measuring 11.7 x 3.6 x 9.8 cm, with solid nodular enhancement and central necrosis and hemorrhage. No evidence of intercostal soft tissue invasion or osseous involvement. The mass appears to be both intra and extra muscular   07/27/2021 Pathology Results   A. SOFT TISSUE, RIGHT BACK, RADICAL EXCISION: Undifferentiated pleomorphic sarcoma, high-grade, involving skeletal muscle with clear but close margins of resection and normal overlying skin. Please see the following synoptic report.  SOFT TISSUE:  Resection  Procedure: Wide resection. Tumor Focality: Unifocal.      Number of Tumors: One (1). Tumor Site: Right side of back. Tumor Size: 11.2 cm. x 7.8 cm. x 5.0 cm. Histologic Type (World Health Organization [facility]) classification of soft tissue tumors): Undifferentiated  pleomorphic sarcoma Histologic Grade ([place] [employer] [facility] [FNCLCC]): Grade 3. Mitotic Rate: 8/10 high-power fields (HPF) Necrosis: Present.      Extent of Necrosis: Focal. Treatment Effect: No known presurgical therapy.      Percentage of Viable Tumor: 95% Margins: All margins negative for tumor.      Distance from Tumor to Closest Margin: 1 mm. (Medial, superior and posterior.)      Other Close Margin(s) to Tumor (<2 cm): 0.2 to 1.2 cm. (Lateral and inferior.)      Margins Involved by Tumor: None Regional Lymph Nodes: Not applicable (no lymph nodes submitted or found).      Number of Lymph Nodes with Tumor: NA.      Number of Lymph Nodes Examined: None (0). Distant Metastasis:      Distant Site(s) Involved: None known. Pathologic Stage Classification (pTNM, AJCC 8th Edition): pT3, pNX Ancillary Studies: Can be performed upon request. Representative Tumor Block: A12. Comment(s): The following immunostains are performed on Block A-6: SMA: Negative. Desmin: Negative. S100: Negative. CD68: Positive and isolated cells. Ki-67: High proliferative index (80 to 85%).  ADDENDUM:  Additional special stains/IHC is performed to exclude the possibility of keratinocytic or vascular lineage. CK AE1/3 (pan-cytokeratin) is negative, militating against an unusual poorly differentiated carcinoma, and CD 31 (vascular/endothelial marker) is also negative, militating against an vascular neoplasm.    09/06/2021 Initial Diagnosis   Sarcoma of chest wall (HCC)   09/06/2021 Cancer Staging   Staging form: Soft Tissue Sarcoma, AJCC 7th Edition - Clinical: Stage III (T2b, N0, M0, G3) - Signed by Dorothy Puffer, MD on 09/06/2021 Lymph-vascular invasion (LVI): LVI not present (absent)/not identified Residual tumor (R): R0 - None   09/26/2021 - 10/28/2021 Radiation Therapy   Radiation Treatment Dates: 09/26/2021 through 10/28/2021 Site Technique Total Dose (Gy) Dose per Fx (Gy) Completed  Fx Beam Energies  Chest Wall, Right: CW_R 3D 50/50 2 25/25 6X            01/21/2022 Imaging   No acute finding seen to explain pain.   Mild fatty change of the liver.   Small appendicoliths within the appendix but no sign of appendicitis.   Moderate amount of stool within the left colon.   03/16/2022 Imaging   CT chest 1. There is an area of mixed airspace and ground-glass attenuation extending to the left lung base. This is nonspecific and may represent an area of inflammation or infection differential considerations include: recurrent aspiration as well as hypoventilatory changes secondary to postsurgical change. Correlation for any clinical signs or symptoms of pneumonia recommended. In the absence of resolution consider referral to thoracic surgery for further management. 2. Postsurgical changes in the right posterior chest wall with persistent thin walled fluid collection within the surgical bed. If there are clinical signs or symptoms of infection percutaneous ultrasound-guided needle aspiration may be indicated to exclude underlying infection. 3. Multiple subacute fracture deformities involving the T4, T5 and T6 spinous processes.   06/23/2022 Procedure   He had incisional biopsy, pathology revealed high-grade pleomorphic sarcomatoid mass versus dedifferentiated sarcoma    08/22/2022 Imaging   MRI chest 1. Postsurgical changes to the right posterior chest wall from tumor resection. Well-defined fluid collection at the resection bed measuring approximately 9.6 x 1.7 x 4.0 cm most compatible with a postoperative seroma or liquified hematoma. No evidence of new or recurrent soft tissue mass within the resection bed. 2. Edema within the right posterior paraspinal musculature and adjacent right shoulder girdle musculature, likely reactive. 3. Ill-defined airspace opacity within the posterior aspect of the right lower lobe. Trace right pleural effusion. 4. Scattered subcentimeter T2  hyperintense lesions within the imaged liver are unchanged from the previous CT and most likely represent cysts.       02/09/2023 Imaging   MR Chest Wo Contrast  Result Date: 02/09/2023 CLINICAL DATA:  History of sarcoma resection from the chest wall in 2022 and subsequent radiotherapy. Chest tightness with limited range of motion at the right shoulder. EXAM: MR CHEST WITHOUT CONTRAST TECHNIQUE: Multiplanar, multisequence MR imaging of the right posterior chest was performed. No intravenous contrast was administered (patient declined contrast). COMPARISON:  Noncontrast chest CT 09/12/2022. MRI of the chest 08/21/2022 and 07/25/2021. FINDINGS: Bones/Joint/Cartilage: There is new T2 marrow hyperintensity posteriorly in several ribs, best seen on the sagittal images. No cortical destruction is demonstrated. No abnormality of the visualized right scapula or thoracic spine identified. Ligaments: No evidence of ligamentous abnormality. Muscles and Tendons: Postsurgical changes in the right posterior paraspinal and chest wall musculature, further described below. Improvement in previously demonstrated intramuscular edema. No acute muscular findings. Soft tissue:  As previously demonstrated, there is a postoperative collection posteriorly in the right chest wall at the site of the previously demonstrated complex, T2 hyperintense mass. This collection remains well-circumscribed and demonstrates T1 and T2 hyperintensity. Compared with the postoperative study of 5 months ago, there is increasing internal complexity with suspected internal septations. Overall dimensions are approximately 8.1 x 3.9 x 1.7 cm (previously 9.6 x 4.0 x 1.7 cm as measured in a similar fashion). No new collections or masses are identified. Small T2 hyperintense liver lesions are unchanged, likely cysts based on previous CT. IMPRESSION: 1. Compared with the postoperative study of 5 months ago, there is increasing internal complexity within the  postoperative collection in the right posterior chest wall. Although there has been no enlargement of this collection, and these changes may relate to radiation therapy, recurrent tumor cannot be excluded by this noncontrast study. Consider tissue sampling. Administration of contrast strongly recommended on subsequent follow-up. 2. New marrow edema posteriorly in several ribs, most likely secondary to prior radiation. 3. No new collections or masses identified. Electronically Signed   By: Carey Bullocks M.D.   On: 02/09/2023 14:57        PHYSICAL EXAMINATION: ECOG PERFORMANCE STATUS: 1 - Symptomatic but completely ambulatory  Vitals:   02/13/23 0909  BP: (!) 170/81  Pulse: 77  Resp: 18  Temp: 98.1 F (36.7 C)  SpO2: 100%   Filed Weights   02/13/23 0909  Weight: 146 lb 12.8 oz (66.6 kg)    GENERAL:alert, no distress and comfortable SKIN: skin color, texture, turgor are normal, no rashes or significant lesions EYES: normal, Conjunctiva are pink and non-injected, sclera clear OROPHARYNX:no exudate, no erythema and lips, buccal mucosa, and tongue normal  NECK: supple, thyroid normal size, non-tender, without nodularity LYMPH:  no palpable lymphadenopathy in the cervical, axillary or inguinal LUNGS: clear to auscultation and percussion with normal breathing effort HEART: regular rate & rhythm and no murmurs and no lower extremity edema ABDOMEN:abdomen soft, non-tender and normal bowel sounds Musculoskeletal:no cyanosis of digits and no clubbing.  Noted radiation-induced fibrosis on his right chest wall near the scapula NEURO: alert & oriented x 3 with fluent speech, no focal motor/sensory deficits  LABORATORY DATA:  I have reviewed the data as listed    Component Value Date/Time   NA 135 01/21/2022 1054   K 4.7 01/21/2022 1054   CL 101 01/21/2022 1054   CO2 26 01/21/2022 1054   GLUCOSE 188 (H) 01/21/2022 1054   BUN 21 01/21/2022 1054   CREATININE 1.27 (H) 01/21/2022 1054    CREATININE 1.23 12/26/2021 0948   CALCIUM 9.0 01/21/2022 1054   PROT 7.5 01/21/2022 1054   ALBUMIN 4.0 01/21/2022 1054   AST 18 01/21/2022 1054   AST 24 12/26/2021 0948   ALT 19 01/21/2022 1054   ALT 35 12/26/2021 0948   ALKPHOS 81 01/21/2022 1054   BILITOT 0.6 01/21/2022 1054   BILITOT 0.5 12/26/2021 0948   GFRNONAA 59 (L) 01/21/2022 1054   GFRNONAA >60 12/26/2021 0948   GFRAA >60 02/17/2015 0611    No results found for: "SPEP", "UPEP"  Lab Results  Component Value Date   WBC 4.8 01/21/2022   NEUTROABS 3.0 01/21/2022   HGB 11.7 (L) 01/21/2022   HCT 35.7 (L) 01/21/2022   MCV 86.0 01/21/2022   PLT 375 01/21/2022      Chemistry      Component Value Date/Time   NA 135 01/21/2022 1054   K 4.7 01/21/2022 1054   CL  101 01/21/2022 1054   CO2 26 01/21/2022 1054   BUN 21 01/21/2022 1054   CREATININE 1.27 (H) 01/21/2022 1054   CREATININE 1.23 12/26/2021 0948      Component Value Date/Time   CALCIUM 9.0 01/21/2022 1054   ALKPHOS 81 01/21/2022 1054   AST 18 01/21/2022 1054   AST 24 12/26/2021 0948   ALT 19 01/21/2022 1054   ALT 35 12/26/2021 0948   BILITOT 0.6 01/21/2022 1054   BILITOT 0.5 12/26/2021 0948       RADIOGRAPHIC STUDIES: I have reviewed multiple imaging studies with the patient I have personally reviewed the radiological images as listed and agreed with the findings in the report. MR Chest Wo Contrast  Result Date: 02/09/2023 CLINICAL DATA:  History of sarcoma resection from the chest wall in 2022 and subsequent radiotherapy. Chest tightness with limited range of motion at the right shoulder. EXAM: MR CHEST WITHOUT CONTRAST TECHNIQUE: Multiplanar, multisequence MR imaging of the right posterior chest was performed. No intravenous contrast was administered (patient declined contrast). COMPARISON:  Noncontrast chest CT 09/12/2022. MRI of the chest 08/21/2022 and 07/25/2021. FINDINGS: Bones/Joint/Cartilage: There is new T2 marrow hyperintensity posteriorly in  several ribs, best seen on the sagittal images. No cortical destruction is demonstrated. No abnormality of the visualized right scapula or thoracic spine identified. Ligaments: No evidence of ligamentous abnormality. Muscles and Tendons: Postsurgical changes in the right posterior paraspinal and chest wall musculature, further described below. Improvement in previously demonstrated intramuscular edema. No acute muscular findings. Soft tissue: As previously demonstrated, there is a postoperative collection posteriorly in the right chest wall at the site of the previously demonstrated complex, T2 hyperintense mass. This collection remains well-circumscribed and demonstrates T1 and T2 hyperintensity. Compared with the postoperative study of 5 months ago, there is increasing internal complexity with suspected internal septations. Overall dimensions are approximately 8.1 x 3.9 x 1.7 cm (previously 9.6 x 4.0 x 1.7 cm as measured in a similar fashion). No new collections or masses are identified. Small T2 hyperintense liver lesions are unchanged, likely cysts based on previous CT. IMPRESSION: 1. Compared with the postoperative study of 5 months ago, there is increasing internal complexity within the postoperative collection in the right posterior chest wall. Although there has been no enlargement of this collection, and these changes may relate to radiation therapy, recurrent tumor cannot be excluded by this noncontrast study. Consider tissue sampling. Administration of contrast strongly recommended on subsequent follow-up. 2. New marrow edema posteriorly in several ribs, most likely secondary to prior radiation. 3. No new collections or masses identified. Electronically Signed   By: Carey Bullocks M.D.   On: 02/09/2023 14:57

## 2023-03-26 ENCOUNTER — Telehealth: Payer: Self-pay | Admitting: Hematology and Oncology

## 2023-03-26 NOTE — Telephone Encounter (Signed)
I called the patient and reviewed recommendation of ordering imaging with contrast again.  He is still adamant that he will not get contrast with imaging He has significant chest tightness but he attributed that to playing too much golf I told the patient that there is limitation of what I can do for evaluation without appropriate imaging study The patient will try to reduce his physical activity and will call me back next month prior to his appointment if he is interested for imaging study

## 2023-05-02 ENCOUNTER — Encounter: Payer: Self-pay | Admitting: Internal Medicine

## 2023-05-15 ENCOUNTER — Inpatient Hospital Stay: Payer: Medicare Other | Admitting: Hematology and Oncology

## 2023-05-15 ENCOUNTER — Inpatient Hospital Stay: Payer: Medicare Other

## 2023-05-16 IMAGING — CT CT CHEST-ABD-PELV W/ CM
2 of 5 series · 13 of 36 positions shown, 15 images · IV contrast (APPLIED)
Comparison: MRI chest July 25, 2021 and CT chest July 21, 2021

CLINICAL DATA: History of chest wall sarcoma

* Tracking Code: BO *
EXAM:
CT CHEST, ABDOMEN, AND PELVIS WITH CONTRAST
TECHNIQUE: Multidetector CT imaging of the chest, abdomen and pelvis was
performed following the standard protocol during bolus
administration of intravenous contrast.

[Series 2: cap with · axial · 0.88mm/px · z∈[-552,-56]mm · 10 of 122 slices shown, 12 images]
[im 12/122  mediastinal]
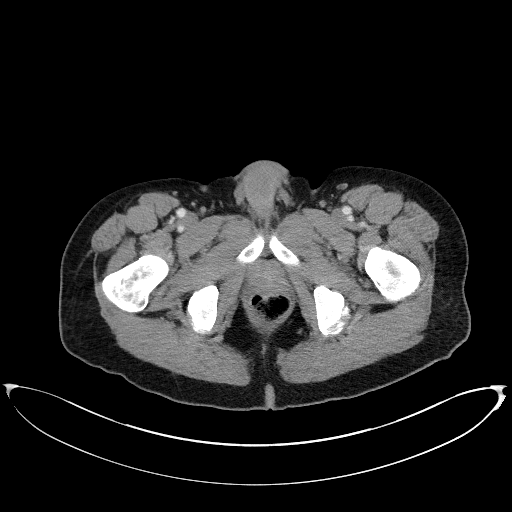
[im 12/122  bone]
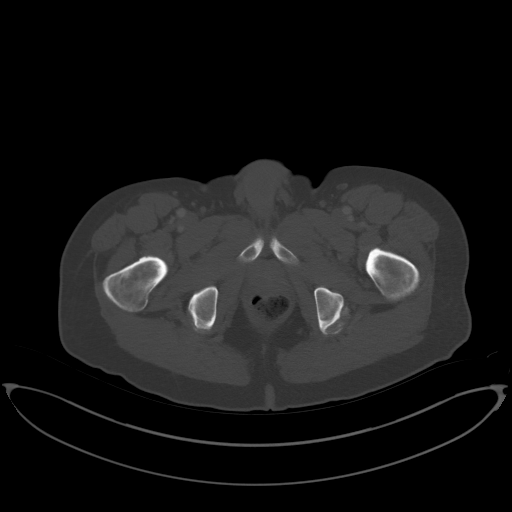
[im 23/122  mediastinal]
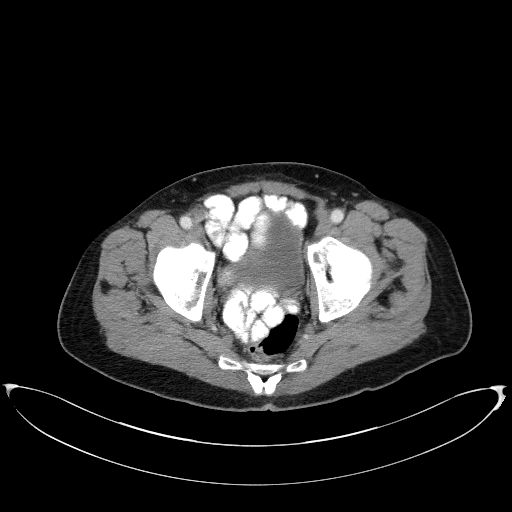
[im 34/122  mediastinal]
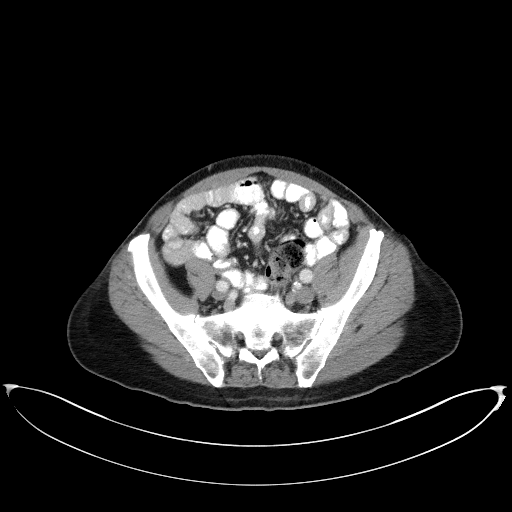
[im 45/122  mediastinal]
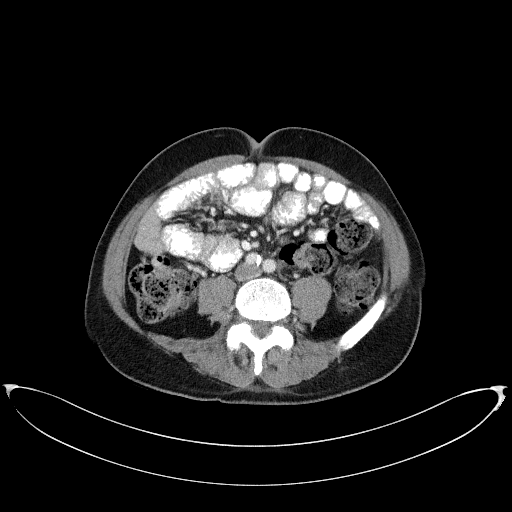
[im 56/122  mediastinal]
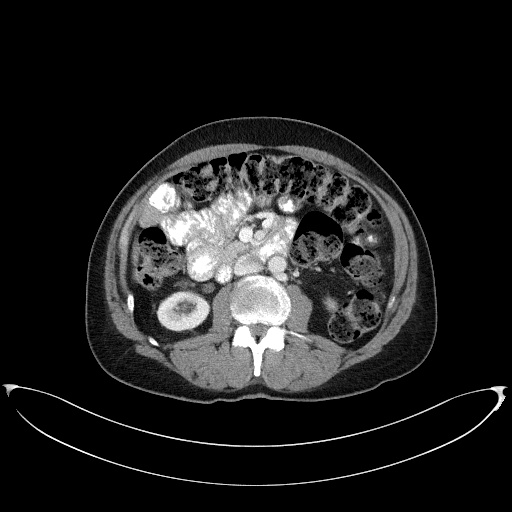
[im 67/122  mediastinal]
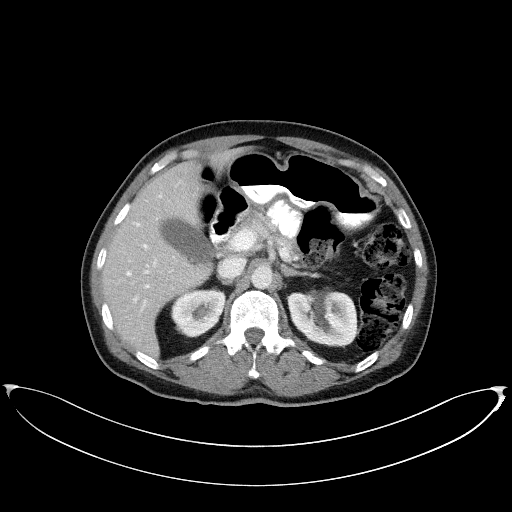
[im 78/122  mediastinal]
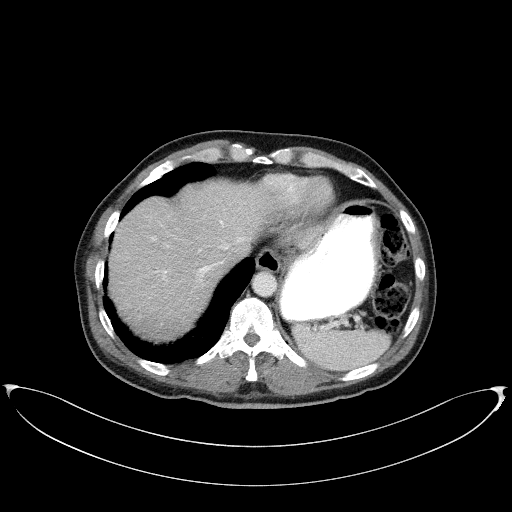
[im 89/122  mediastinal]
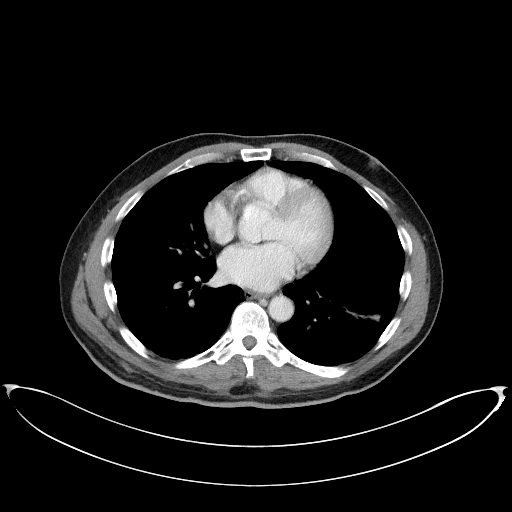
[im 100/122  mediastinal]
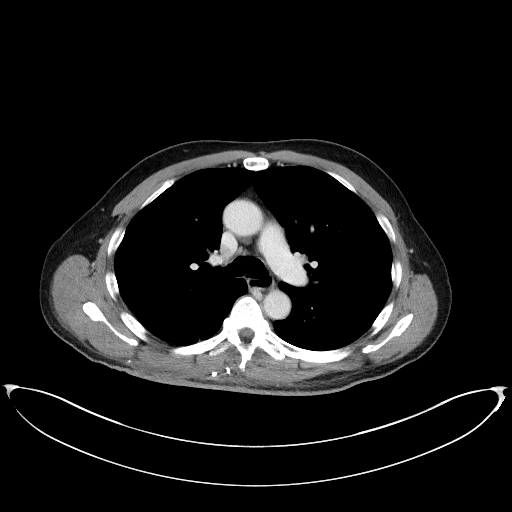
[im 100/122  bone]
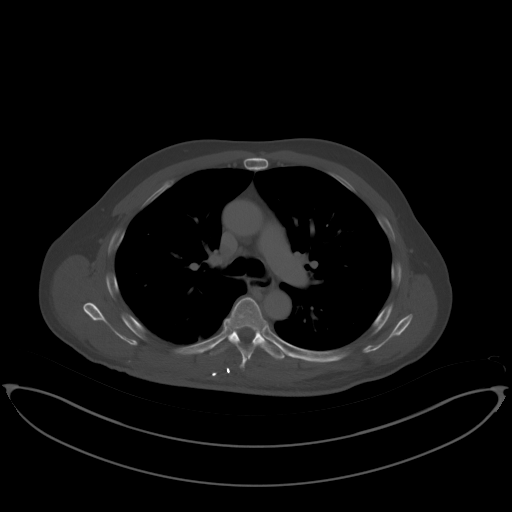
[im 111/122  mediastinal]
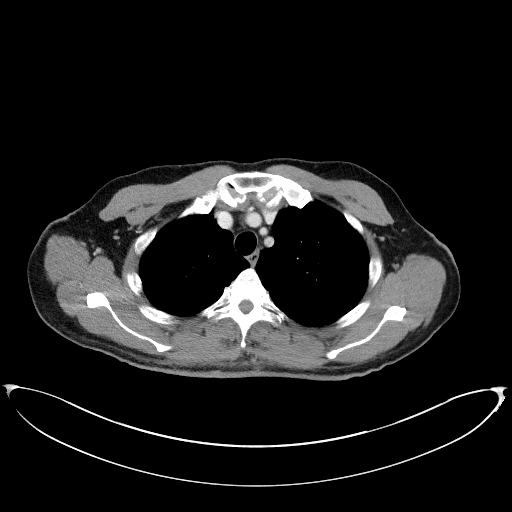

[Series 5: coronals · coronal · 0.82mm/px · 3 of 136 slices shown]
[im 28/136  mediastinal]
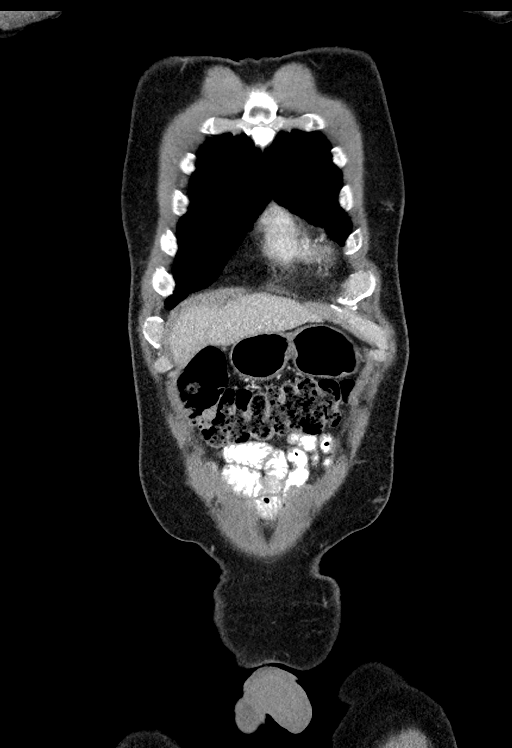
[im 55/136  mediastinal]
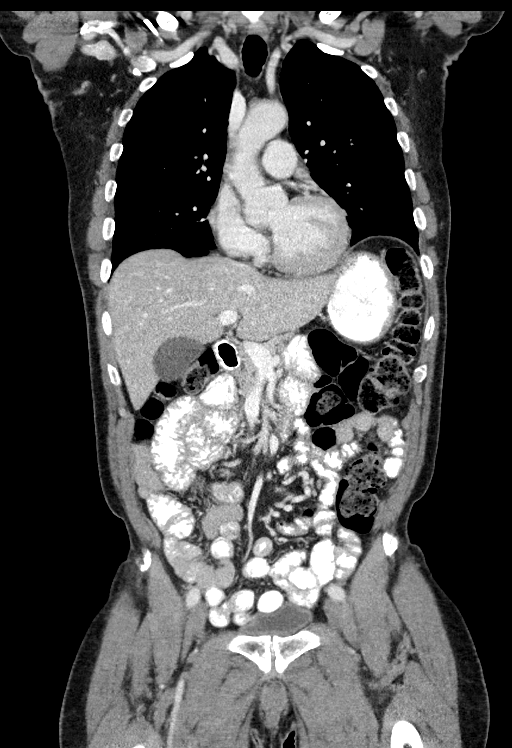
[im 82/136  mediastinal]
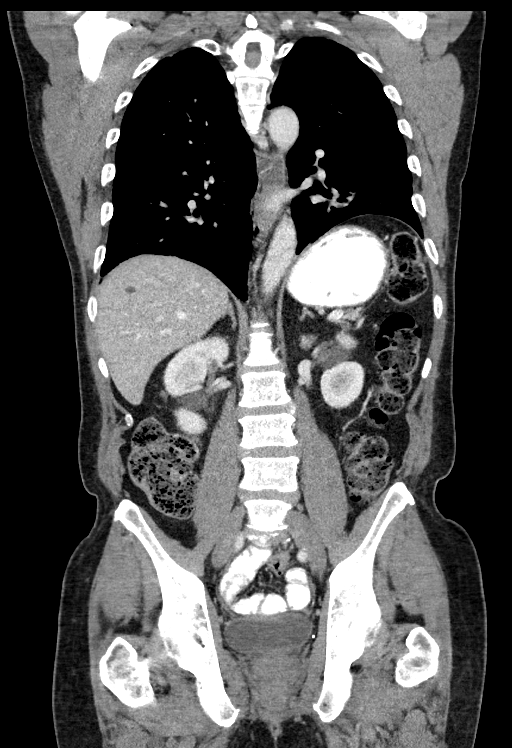

[13 of 36 positions shown; findings below may reference images not displayed]

RADIATION DOSE REDUCTION: This exam was performed according to the
departmental dose-optimization program which includes automated
exposure control, adjustment of the mA and/or kV according to
patient size and/or use of iterative reconstruction technique.

CONTRAST:  100mL OMNIPAQUE IOHEXOL 300 MG/ML  SOLN
FINDINGS: CT CHEST FINDINGS

Cardiovascular: Normal caliber thoracic aorta and central pulmonary
arteries. No central pulmonary embolus on this nondedicated study.
Normal size heart. No significant pericardial effusion/thickening.

Mediastinum/Nodes: No supraclavicular adenopathy. Hypodense 11 mm
nodule in the right lobe of the thyroid. Not clinically significant;
no follow-up imaging recommended (ref: [HOSPITAL]. [DATE]): 143-50).No pathologically enlarged mediastinal, hilar or
axillary lymph nodes. Fluid layering within a patulous esophagus.

Lungs/Pleura: Scattered small right lower lobe pulmonary nodules
measure up to 3 mm for instance on image 100/4 and 106/4.
Hypoventilatory change in the dependent lungs particularly in the
right lung base. Platelike atelectasis in the left lower lobe with
elevation left hemidiaphragm. No pleural effusion or pneumothorax.

Musculoskeletal: Postsurgical change in the posterior right chest
wall with a small thin walled fluid collection in the surgical bed
measuring 5.8 x 1.3 cm on image [DATE] favored to reflect a seroma. No
aggressive lytic or blastic lesion of bone.

CT ABDOMEN PELVIS FINDINGS

Hepatobiliary: Scattered hypodense lesions in the right lobe of the
liver are technically too small to accurately characterize but
stable from CT July 21, 2021 and likely reflect cysts. No solid
enhancing hepatic lesion. Gallbladder is unremarkable. No biliary
ductal dilation.

Pancreas: No pancreatic ductal dilation or evidence of acute
inflammation.

Spleen: No splenomegaly or focal splenic lesion.

Adrenals/Urinary Tract: Bilateral adrenal glands appear normal.
Bilateral renal sinus cysts. No hydronephrosis. Kidneys demonstrate
symmetric enhancement and excretion of contrast. Urinary bladder is
unremarkable for degree of distension.

Stomach/Bowel: Radiopaque enteric contrast material traverses distal
loops of small bowel. The stomach is unremarkable for degree of
distension. No pathologic dilation of small or large bowel. The
appendix and terminal ileum appear normal. Moderate volume of formed
stool throughout the colon. No evidence of acute bowel inflammation.

Vascular/Lymphatic: Normal caliber abdominal aorta. No
pathologically enlarged abdominopelvic lymph nodes.

Reproductive: Prostate is unremarkable.

Other: No significant abdominopelvic free fluid. Sequela of
subcutaneous injections in the left anterior abdominal wall.

Musculoskeletal: Multilevel degenerative changes spine. No
aggressive lytic or blastic lesion of bone.
IMPRESSION: 1. Postsurgical change in the posterior right chest wall mass
resection, with a thin walled fluid collection in the surgical bed
measuring favored to reflect a seroma, without suspicious enhancing
soft tissue to suggest local recurrence.
2. Scattered small right lower lobe pulmonary nodules measure up to
3 mm, nonspecific and favored to reflect an infectious or
inflammatory process. However, metastatic disease not technically
excluded. Consider attention on short-term follow-up chest CT.
3. No evidence of metastatic disease in the abdomen or pelvis. No
thoracic adenopathy.
4. Moderate volume of formed stool throughout the colon. Correlate
for constipation.

## 2023-07-05 DIAGNOSIS — J301 Allergic rhinitis due to pollen: Secondary | ICD-10-CM | POA: Diagnosis not present

## 2023-07-05 DIAGNOSIS — I10 Essential (primary) hypertension: Secondary | ICD-10-CM | POA: Diagnosis not present

## 2023-07-05 DIAGNOSIS — Z85831 Personal history of malignant neoplasm of soft tissue: Secondary | ICD-10-CM | POA: Diagnosis not present

## 2023-07-05 DIAGNOSIS — E782 Mixed hyperlipidemia: Secondary | ICD-10-CM | POA: Diagnosis not present

## 2023-07-05 DIAGNOSIS — K219 Gastro-esophageal reflux disease without esophagitis: Secondary | ICD-10-CM | POA: Diagnosis not present

## 2023-07-05 DIAGNOSIS — E1165 Type 2 diabetes mellitus with hyperglycemia: Secondary | ICD-10-CM | POA: Diagnosis not present

## 2024-01-16 DIAGNOSIS — I10 Essential (primary) hypertension: Secondary | ICD-10-CM | POA: Diagnosis not present

## 2024-01-16 DIAGNOSIS — E782 Mixed hyperlipidemia: Secondary | ICD-10-CM | POA: Diagnosis not present

## 2024-01-16 DIAGNOSIS — J301 Allergic rhinitis due to pollen: Secondary | ICD-10-CM | POA: Diagnosis not present

## 2024-01-16 DIAGNOSIS — E1165 Type 2 diabetes mellitus with hyperglycemia: Secondary | ICD-10-CM | POA: Diagnosis not present

## 2024-01-16 DIAGNOSIS — K219 Gastro-esophageal reflux disease without esophagitis: Secondary | ICD-10-CM | POA: Diagnosis not present

## 2024-04-16 DIAGNOSIS — E113212 Type 2 diabetes mellitus with mild nonproliferative diabetic retinopathy with macular edema, left eye: Secondary | ICD-10-CM | POA: Diagnosis not present

## 2024-04-22 DIAGNOSIS — I10 Essential (primary) hypertension: Secondary | ICD-10-CM | POA: Diagnosis not present

## 2024-04-22 DIAGNOSIS — E782 Mixed hyperlipidemia: Secondary | ICD-10-CM | POA: Diagnosis not present

## 2024-04-22 DIAGNOSIS — E1165 Type 2 diabetes mellitus with hyperglycemia: Secondary | ICD-10-CM | POA: Diagnosis not present

## 2024-04-22 DIAGNOSIS — K219 Gastro-esophageal reflux disease without esophagitis: Secondary | ICD-10-CM | POA: Diagnosis not present

## 2024-04-22 DIAGNOSIS — J301 Allergic rhinitis due to pollen: Secondary | ICD-10-CM | POA: Diagnosis not present

## 2024-05-22 DIAGNOSIS — H2511 Age-related nuclear cataract, right eye: Secondary | ICD-10-CM | POA: Diagnosis not present

## 2024-05-22 DIAGNOSIS — H43811 Vitreous degeneration, right eye: Secondary | ICD-10-CM | POA: Diagnosis not present

## 2024-05-22 DIAGNOSIS — H35033 Hypertensive retinopathy, bilateral: Secondary | ICD-10-CM | POA: Diagnosis not present

## 2024-05-22 DIAGNOSIS — E113391 Type 2 diabetes mellitus with moderate nonproliferative diabetic retinopathy without macular edema, right eye: Secondary | ICD-10-CM | POA: Diagnosis not present

## 2024-05-22 DIAGNOSIS — E113312 Type 2 diabetes mellitus with moderate nonproliferative diabetic retinopathy with macular edema, left eye: Secondary | ICD-10-CM | POA: Diagnosis not present

## 2024-05-22 DIAGNOSIS — H43391 Other vitreous opacities, right eye: Secondary | ICD-10-CM | POA: Diagnosis not present

## 2024-07-08 DIAGNOSIS — M79604 Pain in right leg: Secondary | ICD-10-CM | POA: Diagnosis not present

## 2024-07-08 DIAGNOSIS — E1165 Type 2 diabetes mellitus with hyperglycemia: Secondary | ICD-10-CM | POA: Diagnosis not present

## 2024-08-05 DIAGNOSIS — I10 Essential (primary) hypertension: Secondary | ICD-10-CM | POA: Diagnosis not present

## 2024-08-05 DIAGNOSIS — J301 Allergic rhinitis due to pollen: Secondary | ICD-10-CM | POA: Diagnosis not present

## 2024-08-05 DIAGNOSIS — E1165 Type 2 diabetes mellitus with hyperglycemia: Secondary | ICD-10-CM | POA: Diagnosis not present

## 2024-08-05 DIAGNOSIS — K219 Gastro-esophageal reflux disease without esophagitis: Secondary | ICD-10-CM | POA: Diagnosis not present

## 2024-08-05 DIAGNOSIS — E782 Mixed hyperlipidemia: Secondary | ICD-10-CM | POA: Diagnosis not present

## 2024-10-16 ENCOUNTER — Emergency Department (HOSPITAL_BASED_OUTPATIENT_CLINIC_OR_DEPARTMENT_OTHER)

## 2024-10-16 ENCOUNTER — Other Ambulatory Visit: Payer: Self-pay

## 2024-10-16 ENCOUNTER — Emergency Department (HOSPITAL_BASED_OUTPATIENT_CLINIC_OR_DEPARTMENT_OTHER): Admission: EM | Admit: 2024-10-16 | Discharge: 2024-10-16 | Disposition: A | Discharge status: Home / Self Care

## 2024-10-16 DIAGNOSIS — W19XXXA Unspecified fall, initial encounter: Secondary | ICD-10-CM

## 2024-10-16 DIAGNOSIS — S0990XA Unspecified injury of head, initial encounter: Secondary | ICD-10-CM | POA: Diagnosis present

## 2024-10-16 DIAGNOSIS — S060X0A Concussion without loss of consciousness, initial encounter: Secondary | ICD-10-CM | POA: Diagnosis not present

## 2024-10-16 DIAGNOSIS — Z7982 Long term (current) use of aspirin: Secondary | ICD-10-CM | POA: Insufficient documentation

## 2024-10-16 DIAGNOSIS — Z794 Long term (current) use of insulin: Secondary | ICD-10-CM | POA: Diagnosis not present

## 2024-10-16 DIAGNOSIS — W000XXA Fall on same level due to ice and snow, initial encounter: Secondary | ICD-10-CM | POA: Insufficient documentation

## 2024-10-16 LAB — CBC WITH DIFFERENTIAL/PLATELET
Abs Immature Granulocytes: 0.02 10*3/uL (ref 0.00–0.07)
Basophils Absolute: 0 10*3/uL (ref 0.0–0.1)
Basophils Relative: 1 %
Eosinophils Absolute: 0.1 10*3/uL (ref 0.0–0.5)
Eosinophils Relative: 2 %
HCT: 38.4 % — ABNORMAL LOW (ref 39.0–52.0)
Hemoglobin: 13.1 g/dL (ref 13.0–17.0)
Immature Granulocytes: 0 %
Lymphocytes Relative: 21 %
Lymphs Abs: 1.3 10*3/uL (ref 0.7–4.0)
MCH: 29.8 pg (ref 26.0–34.0)
MCHC: 34.1 g/dL (ref 30.0–36.0)
MCV: 87.5 fL (ref 80.0–100.0)
Monocytes Absolute: 0.5 10*3/uL (ref 0.1–1.0)
Monocytes Relative: 8 %
Neutro Abs: 4.2 10*3/uL (ref 1.7–7.7)
Neutrophils Relative %: 68 %
Platelets: 378 10*3/uL (ref 150–400)
RBC: 4.39 MIL/uL (ref 4.22–5.81)
RDW: 12.2 % (ref 11.5–15.5)
WBC: 6.1 10*3/uL (ref 4.0–10.5)
nRBC: 0 % (ref 0.0–0.2)

## 2024-10-16 LAB — COMPREHENSIVE METABOLIC PANEL WITH GFR
ALT: 12 U/L (ref 0–44)
AST: 18 U/L (ref 15–41)
Albumin: 4.3 g/dL (ref 3.5–5.0)
Alkaline Phosphatase: 86 U/L (ref 38–126)
Anion gap: 11 (ref 5–15)
BUN: 18 mg/dL (ref 8–23)
CO2: 27 mmol/L (ref 22–32)
Calcium: 9.8 mg/dL (ref 8.9–10.3)
Chloride: 101 mmol/L (ref 98–111)
Creatinine, Ser: 1.14 mg/dL (ref 0.61–1.24)
GFR, Estimated: >60 mL/min
Glucose, Bld: 225 mg/dL — ABNORMAL HIGH (ref 70–99)
Potassium: 4.7 mmol/L (ref 3.5–5.1)
Sodium: 139 mmol/L (ref 135–145)
Total Bilirubin: 0.7 mg/dL (ref 0.0–1.2)
Total Protein: 7.4 g/dL (ref 6.5–8.1)

## 2024-10-16 NOTE — Discharge Instructions (Addendum)
 Workup today is unremarkable.  No evidence of bleed.  Please follow-up with your oncologist as we discussed.  Please give them a call.

## 2024-10-16 NOTE — ED Provider Notes (Signed)
 " [place] [facility] AT [facility] Provider Note   CSN: [id number] Arrival date & time: 10/16/24  1207     Patient presents with: Cory Maddox is a 79 y.o. male.    Fall   Patient states that he fell on ice yesterday around 7 PM.  States is mechanical in nature.  He was head.  Did not lose conscious.  Takes 81 mg of aspirin.  Otherwise, no anticoagulation.  States he felt nauseous afterward.  Slightly lightheaded.  This is improved but he still endorses a mild headache.  No neck pain.  No thoracic or lumbar pain.  No chest pain or shortness of breath.  Otherwise, denies all complaints.  Previous medical history reviewed : Last follow-up with heme-onc in May 2024.  Sarcoma of chest wall.  Recommend he follow back up in 3 months.     Prior to Admission medications  Medication Sig Start Date End Date Taking? Authorizing Provider  ondansetron  (ZOFRAN ) 4 MG tablet Take 1 tablet (4 mg total) by mouth 3 (three) times daily as needed for nausea or vomiting. 12/01/22   Federico Rosario BROCKS, MD  amLODipine (NORVASC) 5 MG tablet Take 5 mg by mouth daily.    [provider]  aspirin EC 81 MG tablet Take 81 mg by mouth daily. Swallow whole.    [provider]  Blood Glucose Monitoring Suppl (ACCU-CHEK GUIDE) w/Device KIT U UTD TO CHECK BLOOD SUGAR 09/05/17   [provider]  Blood Pressure Monitoring (BLOOD PRESSURE MONITOR AUTOMAT) DEVI TO CHECK BLOOD PRESSURE TWICE DAILY AS DIRECTED 11/21/17   Job Lukes, PA  Continuous Blood Gluc Sensor (FREESTYLE LIBRE 3 SENSOR) MISC USE TO CHECK BLOOD SUGAR AS DIRECTED 07/20/22   [provider]  glipiZIDE (GLUCOTROL) 5 MG tablet Take 5 mg by mouth daily before breakfast.    [provider]  glucose blood (ACCU-CHEK GUIDE) test strip Use to check blood sugar twice a day and prn 11/21/17   Job Lukes, PA  insulin  glargine (LANTUS) 100 UNIT/ML injection Inject 15 Units into the skin  daily.    [provider]  levocetirizine (XYZAL) 5 MG tablet Take 5 mg by mouth every evening. 02/01/15   [provider]  lisinopril  (ZESTRIL ) 40 MG tablet Take 40 mg by mouth daily. 05/16/21   [provider]  metFORMIN (GLUCOPHAGE) 850 MG tablet Take 850 mg by mouth 2 (two) times daily with a meal.    [provider]  pantoprazole  (PROTONIX ) 40 MG tablet TAKE 1 TABLET(40 MG) BY MOUTH TWICE DAILY FOR 8 WEEKS 11/22/22   Federico Rosario BROCKS, MD    Allergies: Aciphex [rabeprazole], Lisinopril , Pravastatin, and Simvastatin    Review of Systems  Updated Vital Signs BP (!) 180/105 (BP Location: Right Arm)   Pulse 90   Temp 98.4 F (36.9 C)   Resp 17   SpO2 100%   Physical Exam  (all labs ordered are listed, but only abnormal results are displayed) Labs Reviewed  CBC WITH DIFFERENTIAL/PLATELET - Abnormal; Notable for the following components:      Result Value   HCT 38.4 (*)    All other components within normal limits  COMPREHENSIVE METABOLIC PANEL WITH GFR - Abnormal; Notable for the following components:   Glucose, Bld 225 (*)    All other components within normal limits    EKG: EKG Interpretation Date/Time:  Thursday October 16 2024 13:27:18 EST Ventricular Rate:  77 PR Interval:  129 QRS  Duration:  81 QT Interval:  370 QTC Calculation: 419 R Axis:   39  Text Interpretation: Sinus rhythm Baseline wander in lead(s) V2 Confirmed by Simon Rea [phone]) on 10/16/2024 1:53:43 PM  Radiology: CT Head Wo Contrast Result Date: 10/16/2024 EXAM: CT HEAD AND CERVICAL SPINE 10/16/2024 01:09:00 PM TECHNIQUE: CT of the head and cervical spine was performed without the administration of intravenous contrast. Multiplanar reformatted images are provided for review. Automated exposure control, iterative reconstruction, and/or weight based adjustment of the mA/kV was utilized to reduce the radiation dose to as low as reasonably achievable. COMPARISON: None  available. CLINICAL HISTORY: Fall; blunt trauma. FINDINGS: CT HEAD BRAIN AND VENTRICLES: No acute intracranial hemorrhage. No mass effect or midline shift. No abnormal extra-axial fluid collection. No evidence of acute infarct. No hydrocephalus. ORBITS: No acute abnormality. SINUSES AND MASTOIDS: No acute abnormality. SOFT TISSUES AND SKULL: No acute skull fracture. No acute soft tissue abnormality. CT CERVICAL SPINE BONES AND ALIGNMENT: No acute fracture or traumatic malalignment. DEGENERATIVE CHANGES: Mild to moderate multilevel degenerative change including degenerative disc disease and endplate spurring SOFT TISSUES: No prevertebral soft tissue swelling. IMPRESSION: 1. No acute intracranial abnormality. 2. No acute fracture or traumatic malalignment of the cervical spine. Electronically signed by: Glendia Molt MD 10/16/2024 02:03 PM EST RP Workstation: [id number]   CT Cervical Spine Wo Contrast Result Date: 10/16/2024 EXAM: CT HEAD AND CERVICAL SPINE 10/16/2024 01:09:00 PM TECHNIQUE: CT of the head and cervical spine was performed without the administration of intravenous contrast. Multiplanar reformatted images are provided for review. Automated exposure control, iterative reconstruction, and/or weight based adjustment of the mA/kV was utilized to reduce the radiation dose to as low as reasonably achievable. COMPARISON: None available. CLINICAL HISTORY: Fall; blunt trauma. FINDINGS: CT HEAD BRAIN AND VENTRICLES: No acute intracranial hemorrhage. No mass effect or midline shift. No abnormal extra-axial fluid collection. No evidence of acute infarct. No hydrocephalus. ORBITS: No acute abnormality. SINUSES AND MASTOIDS: No acute abnormality. SOFT TISSUES AND SKULL: No acute skull fracture. No acute soft tissue abnormality. CT CERVICAL SPINE BONES AND ALIGNMENT: No acute fracture or traumatic malalignment. DEGENERATIVE CHANGES: Mild to moderate multilevel degenerative change including degenerative disc disease  and endplate spurring SOFT TISSUES: No prevertebral soft tissue swelling. IMPRESSION: 1. No acute intracranial abnormality. 2. No acute fracture or traumatic malalignment of the cervical spine. Electronically signed by: Glendia Molt MD 10/16/2024 02:03 PM EST RP Workstation: [id number]     Procedures   Medications Ordered in the ED - No data to display                                  Medical Decision Making Amount and/or Complexity of Data Reviewed Labs: ordered. Radiology: ordered.     HPI:  Patient states that he fell on ice yesterday around 7 PM.  States is mechanical in nature.  He was head.  Did not lose conscious.  Takes 81 mg of aspirin.  Otherwise, no anticoagulation.  States he felt nauseous afterward.  Slightly lightheaded.  This is improved but he still endorses a mild headache.  No neck pain.  No thoracic or lumbar pain.  No chest pain or shortness of breath.  Otherwise, denies all complaints.  Previous medical history reviewed : Last follow-up with heme-onc in May 2024.  Sarcoma of chest wall.  Recommend he follow back up in 3 months.     MDM:   Upon examination, patient  hemodynamically stable. A&O x 3 with GCS 15.  Neurologically intact.  NIH is 0.  Cranials 2 through 12 intact.  No concerns for CVA.  Will assess with CT head to rule out an Kuneff subdural epidural.  CT C-spine to rule out cervical fracture.  Obtain basic laboratory workup given he is feeling slightly nauseous as well as vertiginous think this is likely because of concussive symptoms.  EKG as well.  Reevaluation:   Upon reexamination, patient hemodynamically stable.  Remains A&O x 3 with GCS 15.  EKG sinus.  Labs unremarkable.  No electrolyte derangements.  CT head CT C-spine clear.   Unrelated to today's visit that patient is supposed to follow back up with hematology oncology.  Last note was 2024.  Encourage patient to follow-up with them.  Gave patient their number again.  He states that  he will give them a call today.   EKG Interpreted by Me: sinus      I have independently interpreted the CT  images and agree with the radiologist finding     Final diagnoses:  Fall, initial encounter  Concussion without loss of consciousness, initial encounter    ED Discharge Orders     None          Simon Lavonia SAILOR, MD 10/16/24 1429  "

## 2024-10-16 NOTE — ED Triage Notes (Signed)
 Pt caox4 ambulatory NAD reporting he fell in driveway 2000 last night , woke up feeling nauseous and light headed around 0100 until eating around 0700 which subsided nausea but still feeling a little lightheaded. Does not take blood thinner. Denies LOC.

## 2024-10-16 NOTE — ED Notes (Signed)
 ED Provider at bedside.
